# Patient Record
Sex: Male | Born: 1935 | Race: White | Hispanic: No | Smoking: Former smoker
Health system: Southern US, Community
[De-identification: ages and names within clinical notes are randomized; demographics above are authoritative.]

## PROBLEM LIST (undated history)

## (undated) DIAGNOSIS — K5792 Diverticulitis of intestine, part unspecified, without perforation or abscess without bleeding: Secondary | ICD-10-CM

## (undated) DIAGNOSIS — E785 Hyperlipidemia, unspecified: Secondary | ICD-10-CM

## (undated) DIAGNOSIS — D126 Benign neoplasm of colon, unspecified: Secondary | ICD-10-CM

## (undated) DIAGNOSIS — C801 Malignant (primary) neoplasm, unspecified: Secondary | ICD-10-CM

## (undated) DIAGNOSIS — I10 Essential (primary) hypertension: Secondary | ICD-10-CM

## (undated) HISTORY — PX: SKIN CANCER EXCISION: SHX779

## (undated) HISTORY — PX: COLONOSCOPY: SHX174

## (undated) HISTORY — DX: Diverticulitis of intestine, part unspecified, without perforation or abscess without bleeding: K57.92

## (undated) HISTORY — DX: Hyperlipidemia, unspecified: E78.5

## (undated) HISTORY — DX: Malignant (primary) neoplasm, unspecified: C80.1

## (undated) HISTORY — DX: Essential (primary) hypertension: I10

## (undated) HISTORY — DX: Benign neoplasm of colon, unspecified: D12.6

## (undated) HISTORY — PX: POLYPECTOMY: SHX149

---

## 2001-11-29 ENCOUNTER — Emergency Department (HOSPITAL_COMMUNITY): Admission: EM | Admit: 2001-11-29 | Discharge: 2001-11-29 | Payer: Self-pay | Admitting: Emergency Medicine

## 2004-12-19 ENCOUNTER — Encounter (INDEPENDENT_AMBULATORY_CARE_PROVIDER_SITE_OTHER): Payer: Self-pay | Admitting: *Deleted

## 2004-12-19 ENCOUNTER — Ambulatory Visit (HOSPITAL_BASED_OUTPATIENT_CLINIC_OR_DEPARTMENT_OTHER): Admission: RE | Admit: 2004-12-19 | Discharge: 2004-12-19 | Payer: Self-pay | Admitting: Plastic Surgery

## 2008-01-12 ENCOUNTER — Ambulatory Visit: Payer: Self-pay | Admitting: Gastroenterology

## 2008-01-26 ENCOUNTER — Ambulatory Visit: Payer: Self-pay | Admitting: Gastroenterology

## 2008-01-26 ENCOUNTER — Encounter: Payer: Self-pay | Admitting: Gastroenterology

## 2008-01-27 ENCOUNTER — Encounter: Payer: Self-pay | Admitting: Gastroenterology

## 2009-10-16 ENCOUNTER — Inpatient Hospital Stay (HOSPITAL_COMMUNITY): Admission: EM | Admit: 2009-10-16 | Discharge: 2009-10-17 | Payer: Self-pay | Admitting: Emergency Medicine

## 2010-06-24 LAB — CARDIAC PANEL(CRET KIN+CKTOT+MB+TROPI)
CK, MB: 1.8 ng/mL (ref 0.3–4.0)
Relative Index: 1.6 (ref 0.0–2.5)
Total CK: 110 U/L (ref 7–232)
Troponin I: 0.02 ng/mL (ref 0.00–0.06)
Troponin I: 0.02 ng/mL (ref 0.00–0.06)

## 2010-06-24 LAB — COMPREHENSIVE METABOLIC PANEL
ALT: 23 U/L (ref 0–53)
Albumin: 3.9 g/dL (ref 3.5–5.2)
Alkaline Phosphatase: 50 U/L (ref 39–117)
Creatinine, Ser: 1.05 mg/dL (ref 0.4–1.5)
Glucose, Bld: 95 mg/dL (ref 70–99)
Potassium: 3.5 mEq/L (ref 3.5–5.1)
Total Bilirubin: 0.8 mg/dL (ref 0.3–1.2)

## 2010-06-24 LAB — CBC
Hemoglobin: 13.8 g/dL (ref 13.0–17.0)
MCH: 32.5 pg (ref 26.0–34.0)
RBC: 4.19 MIL/uL — ABNORMAL LOW (ref 4.22–5.81)
RBC: 4.3 MIL/uL (ref 4.22–5.81)
RDW: 13.2 % (ref 11.5–15.5)
RDW: 13.3 % (ref 11.5–15.5)
WBC: 6.7 10*3/uL (ref 4.0–10.5)

## 2010-06-24 LAB — BASIC METABOLIC PANEL
CO2: 30 mEq/L (ref 19–32)
Creatinine, Ser: 0.99 mg/dL (ref 0.4–1.5)
Sodium: 138 mEq/L (ref 135–145)

## 2010-06-24 LAB — DIFFERENTIAL
Basophils Absolute: 0 10*3/uL (ref 0.0–0.1)
Eosinophils Absolute: 0.2 10*3/uL (ref 0.0–0.7)
Eosinophils Relative: 3 % (ref 0–5)
Lymphs Abs: 2.4 10*3/uL (ref 0.7–4.0)
Monocytes Absolute: 0.5 10*3/uL (ref 0.1–1.0)
Monocytes Relative: 8 % (ref 3–12)

## 2010-06-24 LAB — URINALYSIS, ROUTINE W REFLEX MICROSCOPIC
Glucose, UA: NEGATIVE mg/dL
Hgb urine dipstick: NEGATIVE
Nitrite: NEGATIVE
Specific Gravity, Urine: 1.016 (ref 1.005–1.030)
Urobilinogen, UA: 0.2 mg/dL (ref 0.0–1.0)

## 2010-06-24 LAB — LIPID PANEL: HDL: 36 mg/dL — ABNORMAL LOW (ref >39)

## 2010-06-24 LAB — APTT: aPTT: 36 seconds (ref 24–37)

## 2010-06-24 LAB — HEMOGLOBIN A1C
Hgb A1c MFr Bld: 5.7 % — ABNORMAL HIGH (ref <5.7)
Mean Plasma Glucose: 117 mg/dL — ABNORMAL HIGH (ref <117)

## 2010-06-24 LAB — PROTIME-INR: Prothrombin Time: 14.2 seconds (ref 11.6–15.2)

## 2011-02-14 ENCOUNTER — Encounter: Payer: Self-pay | Admitting: Gastroenterology

## 2011-04-11 ENCOUNTER — Encounter: Payer: Self-pay | Admitting: Gastroenterology

## 2011-05-01 ENCOUNTER — Ambulatory Visit (AMBULATORY_SURGERY_CENTER): Payer: Medicare Other | Admitting: *Deleted

## 2011-05-01 DIAGNOSIS — Z1211 Encounter for screening for malignant neoplasm of colon: Secondary | ICD-10-CM

## 2011-05-01 DIAGNOSIS — Z86010 Personal history of colonic polyps: Secondary | ICD-10-CM

## 2011-05-01 MED ORDER — PEG-KCL-NACL-NASULF-NA ASC-C 100 G PO SOLR: 1.0000 | Freq: Once | ORAL | Status: DC

## 2011-05-10 DIAGNOSIS — D126 Benign neoplasm of colon, unspecified: Secondary | ICD-10-CM

## 2011-05-10 HISTORY — DX: Benign neoplasm of colon, unspecified: D12.6

## 2011-05-15 ENCOUNTER — Encounter: Payer: Self-pay | Admitting: Gastroenterology

## 2011-05-15 ENCOUNTER — Ambulatory Visit (AMBULATORY_SURGERY_CENTER): Payer: Medicare Other | Admitting: Gastroenterology

## 2011-05-15 DIAGNOSIS — Z1211 Encounter for screening for malignant neoplasm of colon: Secondary | ICD-10-CM | POA: Diagnosis not present

## 2011-05-15 DIAGNOSIS — D126 Benign neoplasm of colon, unspecified: Secondary | ICD-10-CM

## 2011-05-15 DIAGNOSIS — K5792 Diverticulitis of intestine, part unspecified, without perforation or abscess without bleeding: Secondary | ICD-10-CM

## 2011-05-15 DIAGNOSIS — Z86010 Personal history of colonic polyps: Secondary | ICD-10-CM | POA: Diagnosis not present

## 2011-05-15 MED ORDER — CIPROFLOXACIN HCL 500 MG PO TABS: 500.0000 mg | ORAL_TABLET | Freq: Two times a day (BID) | ORAL | Status: AC

## 2011-05-15 MED ORDER — SODIUM CHLORIDE 0.9 % IV SOLN: 500.0000 mL | INTRAVENOUS | Status: DC

## 2011-05-15 NOTE — Patient Instructions (Signed)
Discharge instructions given with verbal understanding.  Handouts on polyps and diverticulosis given.  Resume previous medications. 

## 2011-05-15 NOTE — Progress Notes (Signed)
Patient did not experience any of the following events: a burn prior to discharge; a fall within the facility; wrong site/side/patient/procedure/implant event; or a hospital transfer or hospital admission upon discharge from the facility. (G8907) Patient did not have preoperative order for IV antibiotic SSI prophylaxis. (G8918)  

## 2011-05-15 NOTE — Op Note (Signed)
[facility] [address] [place], [place]  [address]  COLONOSCOPY PROCEDURE REPORT  PATIENT:  Philip Donaldson, Philip Donaldson  MR#:  [id number] BIRTHDATE:  [birth year], 82 yrs. old  GENDER:  male ENDOSCOPIST:  Judie Petit T. Russella Dar, MD, [facility]  PROCEDURE DATE:  05/15/2011 PROCEDURE:  Colonoscopy with biopsy and snare polypectomy ASA CLASS:  Class II INDICATIONS:  1) surveillance and high-risk screening  2) history of pre-cancerous (adenomatous) colon polyps: 1998, 2004, 2009. MEDICATIONS:   These medications were titrated to patient response per physician's verbal order, Fentanyl 25 mcg IV, Versed 5 mg IV DESCRIPTION OF PROCEDURE:   After the risks benefits and alternatives of the procedure were thoroughly explained, informed consent was obtained.  Digital rectal exam was performed and revealed no abnormalities.   The LB 180AL [id number] endoscope was introduced through the anus and advanced to the cecum, which was identified by both the appendix and ileocecal valve, without limitations.  The quality of the prep was good, using MoviPrep. The instrument was then slowly withdrawn as the colon was fully examined. <<PROCEDUREIMAGES>> FINDINGS:  A sessile polyp was found in the ascending colon. It was 4 mm in size. The polyp was removed using cold biopsy forceps. A sessile polyp was found at the hepatic flexure. It was 4 mm in size. The polyp was removed using cold biopsy forceps.  A sessile polyp was found in the sigmoid colon. It was 6 mm in size. Polyp was snared without cautery. Retrieval was successful.  Moderate diverticulosis was found in the sigmoid colon.  Diverticulitis was found in the sigmoid colon. Otherwise normal colonoscopy without other polyps, masses, vascular ectasias, or inflammatory changes. Retroflexed views in the rectum revealed no abnormalities.   The time to cecum =  2  minutes. The scope was then withdrawn (time = 11.75  min) from the patient and the procedure  completed.  COMPLICATIONS:  None  ENDOSCOPIC IMPRESSION: 1) 4 mm sessile polyp in the ascending colon 2) 4 mm sessile polyp at the hepatic flexure 3) 6 mm sessile polyp in the sigmoid colon 4) Moderate diverticulosis in the sigmoid colon 5) Diverticulitis in the sigmoid colon  RECOMMENDATIONS: 1) Await pathology results 2) High fiber diet with liberal fluid intake. 3) Cipro 500 mg po bid, #14 4) Given your age, you will not need another colonoscopy for colon cancer screening or polyp surveillance. These types of tests usually stop around the age 83.  Venita Lick. Russella Dar, MD, [facility]  n. eSIGNED:   Venita Lick. Lamere Lightner at 05/15/2011 09:00 AM  [place], Onalee Hua, [phone]

## 2011-05-16 ENCOUNTER — Telehealth: Payer: Self-pay | Admitting: *Deleted

## 2011-05-16 NOTE — Telephone Encounter (Signed)
  Follow up Call-  Call back number 05/15/2011  Post procedure Call Back phone  # [phone]  Permission to leave phone message Yes     Patient questions:  Do you have a fever, pain , or abdominal swelling? no Pain Score  0 *  Have you tolerated food without any problems? no  Have you been able to return to your normal activities? yes  Do you have any questions about your discharge instructions: Diet   no Medications  no Follow up visit  no  Do you have questions or concerns about your Care? no  Actions: * If pain score is 4 or above: No action needed, pain <4.

## 2011-05-16 NOTE — Telephone Encounter (Signed)
  Follow up Call-  Call back number 05/15/2011  Post procedure Call Back phone  # [phone]  Permission to leave phone message Yes     Patient questions:  Do you have a fever, pain , or abdominal swelling? no Pain Score  0 *  Have you tolerated food without any problems? yes  Have you been able to return to your normal activities? yes  Do you have any questions about your discharge instructions: Diet   no Medications  no Follow up visit  no  Do you have questions or concerns about your Care? no  Actions: * If pain score is 4 or above: No action needed, pain <4.

## 2011-05-30 ENCOUNTER — Encounter: Payer: Self-pay | Admitting: Gastroenterology

## 2011-06-05 ENCOUNTER — Telehealth: Payer: Self-pay | Admitting: Gastroenterology

## 2011-06-05 NOTE — Telephone Encounter (Signed)
Yes Analpram tid prn

## 2011-06-05 NOTE — Telephone Encounter (Signed)
Patient reports rectal itch since colon on 05/15/11.  He is using OTC products that seem to control the itch, but not clear it up.  Dr Russella Dar is is ok to call in analpram?

## 2011-06-06 MED ORDER — PRAMOXINE-HC 1-2.5 % EX CREA: TOPICAL_CREAM | CUTANEOUS | Status: AC

## 2011-06-06 NOTE — Telephone Encounter (Signed)
Patient's wife advised.  They are asked to call back for any further questions or concerns

## 2011-06-25 ENCOUNTER — Telehealth: Payer: Self-pay | Admitting: Gastroenterology

## 2011-06-25 NOTE — Telephone Encounter (Signed)
Patient is having continued rectal itching.  It is around his rectum and the analpram is not helping.  I have asked them to try some monistat to the rectal area, to rule out a yeast rash.  She will try this.

## 2011-08-19 DIAGNOSIS — E7800 Pure hypercholesterolemia, unspecified: Secondary | ICD-10-CM | POA: Diagnosis not present

## 2011-08-19 DIAGNOSIS — I10 Essential (primary) hypertension: Secondary | ICD-10-CM | POA: Diagnosis not present

## 2011-08-19 DIAGNOSIS — Z79899 Other long term (current) drug therapy: Secondary | ICD-10-CM | POA: Diagnosis not present

## 2012-01-07 DIAGNOSIS — H40019 Open angle with borderline findings, low risk, unspecified eye: Secondary | ICD-10-CM | POA: Diagnosis not present

## 2012-01-13 DIAGNOSIS — Z23 Encounter for immunization: Secondary | ICD-10-CM | POA: Diagnosis not present

## 2012-02-02 DIAGNOSIS — M109 Gout, unspecified: Secondary | ICD-10-CM

## 2012-02-02 HISTORY — DX: Gout, unspecified: M10.9

## 2012-02-18 DIAGNOSIS — H2510 Age-related nuclear cataract, unspecified eye: Secondary | ICD-10-CM | POA: Diagnosis not present

## 2012-02-18 DIAGNOSIS — H409 Unspecified glaucoma: Secondary | ICD-10-CM | POA: Diagnosis not present

## 2012-02-18 DIAGNOSIS — H4011X0 Primary open-angle glaucoma, stage unspecified: Secondary | ICD-10-CM | POA: Diagnosis not present

## 2012-03-02 DIAGNOSIS — I10 Essential (primary) hypertension: Secondary | ICD-10-CM | POA: Diagnosis not present

## 2012-03-02 DIAGNOSIS — Z79899 Other long term (current) drug therapy: Secondary | ICD-10-CM | POA: Diagnosis not present

## 2012-03-02 DIAGNOSIS — E7800 Pure hypercholesterolemia, unspecified: Secondary | ICD-10-CM | POA: Diagnosis not present

## 2012-03-02 DIAGNOSIS — Z1331 Encounter for screening for depression: Secondary | ICD-10-CM | POA: Diagnosis not present

## 2012-03-11 DIAGNOSIS — L821 Other seborrheic keratosis: Secondary | ICD-10-CM | POA: Diagnosis not present

## 2012-03-11 DIAGNOSIS — Z85820 Personal history of malignant melanoma of skin: Secondary | ICD-10-CM | POA: Diagnosis not present

## 2012-03-11 DIAGNOSIS — L570 Actinic keratosis: Secondary | ICD-10-CM | POA: Diagnosis not present

## 2012-03-11 DIAGNOSIS — D045 Carcinoma in situ of skin of trunk: Secondary | ICD-10-CM | POA: Diagnosis not present

## 2012-03-11 DIAGNOSIS — C44529 Squamous cell carcinoma of skin of other part of trunk: Secondary | ICD-10-CM | POA: Diagnosis not present

## 2012-03-25 DIAGNOSIS — D045 Carcinoma in situ of skin of trunk: Secondary | ICD-10-CM | POA: Diagnosis not present

## 2012-04-27 DIAGNOSIS — D045 Carcinoma in situ of skin of trunk: Secondary | ICD-10-CM | POA: Diagnosis not present

## 2012-06-02 DIAGNOSIS — H409 Unspecified glaucoma: Secondary | ICD-10-CM | POA: Diagnosis not present

## 2012-06-02 DIAGNOSIS — H4011X0 Primary open-angle glaucoma, stage unspecified: Secondary | ICD-10-CM | POA: Diagnosis not present

## 2012-09-01 DIAGNOSIS — M7989 Other specified soft tissue disorders: Secondary | ICD-10-CM | POA: Diagnosis not present

## 2012-09-01 DIAGNOSIS — Z79899 Other long term (current) drug therapy: Secondary | ICD-10-CM | POA: Diagnosis not present

## 2012-09-01 DIAGNOSIS — E7800 Pure hypercholesterolemia, unspecified: Secondary | ICD-10-CM | POA: Diagnosis not present

## 2012-09-01 DIAGNOSIS — I10 Essential (primary) hypertension: Secondary | ICD-10-CM | POA: Diagnosis not present

## 2012-09-29 DIAGNOSIS — H2510 Age-related nuclear cataract, unspecified eye: Secondary | ICD-10-CM | POA: Diagnosis not present

## 2012-11-25 DIAGNOSIS — F05 Delirium due to known physiological condition: Secondary | ICD-10-CM | POA: Diagnosis not present

## 2012-11-25 DIAGNOSIS — I10 Essential (primary) hypertension: Secondary | ICD-10-CM | POA: Diagnosis not present

## 2012-11-25 DIAGNOSIS — Z79899 Other long term (current) drug therapy: Secondary | ICD-10-CM | POA: Diagnosis not present

## 2012-11-25 DIAGNOSIS — E7800 Pure hypercholesterolemia, unspecified: Secondary | ICD-10-CM | POA: Diagnosis not present

## 2013-01-05 DIAGNOSIS — Z23 Encounter for immunization: Secondary | ICD-10-CM | POA: Diagnosis not present

## 2013-01-26 DIAGNOSIS — H409 Unspecified glaucoma: Secondary | ICD-10-CM | POA: Diagnosis not present

## 2013-01-26 DIAGNOSIS — H4011X0 Primary open-angle glaucoma, stage unspecified: Secondary | ICD-10-CM | POA: Diagnosis not present

## 2013-05-17 DIAGNOSIS — D235 Other benign neoplasm of skin of trunk: Secondary | ICD-10-CM | POA: Diagnosis not present

## 2013-05-17 DIAGNOSIS — D045 Carcinoma in situ of skin of trunk: Secondary | ICD-10-CM | POA: Diagnosis not present

## 2013-05-17 DIAGNOSIS — L538 Other specified erythematous conditions: Secondary | ICD-10-CM | POA: Diagnosis not present

## 2013-05-17 DIAGNOSIS — Z85820 Personal history of malignant melanoma of skin: Secondary | ICD-10-CM | POA: Diagnosis not present

## 2013-05-26 DIAGNOSIS — H409 Unspecified glaucoma: Secondary | ICD-10-CM | POA: Diagnosis not present

## 2013-05-26 DIAGNOSIS — H4011X0 Primary open-angle glaucoma, stage unspecified: Secondary | ICD-10-CM | POA: Diagnosis not present

## 2013-05-28 DIAGNOSIS — Z79899 Other long term (current) drug therapy: Secondary | ICD-10-CM | POA: Diagnosis not present

## 2013-05-28 DIAGNOSIS — E7800 Pure hypercholesterolemia, unspecified: Secondary | ICD-10-CM | POA: Diagnosis not present

## 2013-05-28 DIAGNOSIS — Z1331 Encounter for screening for depression: Secondary | ICD-10-CM | POA: Diagnosis not present

## 2013-05-28 DIAGNOSIS — I10 Essential (primary) hypertension: Secondary | ICD-10-CM | POA: Diagnosis not present

## 2013-08-20 ENCOUNTER — Encounter: Payer: Self-pay | Admitting: Gastroenterology

## 2013-09-22 DIAGNOSIS — H409 Unspecified glaucoma: Secondary | ICD-10-CM | POA: Diagnosis not present

## 2013-09-22 DIAGNOSIS — H4011X0 Primary open-angle glaucoma, stage unspecified: Secondary | ICD-10-CM | POA: Diagnosis not present

## 2013-10-13 DIAGNOSIS — D234 Other benign neoplasm of skin of scalp and neck: Secondary | ICD-10-CM | POA: Diagnosis not present

## 2013-10-20 ENCOUNTER — Ambulatory Visit (INDEPENDENT_AMBULATORY_CARE_PROVIDER_SITE_OTHER): Payer: Medicare Other | Admitting: Gastroenterology

## 2013-10-20 ENCOUNTER — Encounter: Payer: Self-pay | Admitting: Gastroenterology

## 2013-10-20 VITALS — BP 142/76 | HR 58 | Ht 66.0 in | Wt 188.0 lb

## 2013-10-20 DIAGNOSIS — K921 Melena: Secondary | ICD-10-CM

## 2013-10-20 DIAGNOSIS — L290 Pruritus ani: Secondary | ICD-10-CM

## 2013-10-20 MED ORDER — HYDROCORTISONE ACETATE 25 MG RE SUPP: 25.0000 mg | Freq: Every day | RECTAL | Status: DC

## 2013-10-20 NOTE — Progress Notes (Signed)
    History of Present Illness: This is an 80 year old male who relates intermittent rectal itching and small volume rectal bleeding. He has a history of adenomatous colon polyps and has undergone several colonoscopies over the years. His most recent colonoscopy was performed in February 2013 with 3 colon polyps, diverticulosis, and diverticulitis reported. I reviewed the endoscopic images and retroflex view shows small internal hemorrhoids.    Current Medications, Allergies, Past Medical History, Past Surgical History, Family History and Social History were reviewed in [employer] record.  Physical Exam: General: Well developed , well nourished, no acute distress Head: Normocephalic and atraumatic Eyes:  sclerae anicteric, EOMI Ears: Normal auditory acuity Mouth: No deformity or lesions Lungs: Clear throughout to auscultation Heart: Regular rate and rhythm; no murmurs, rubs or bruits Abdomen: Soft, non tender and non distended. No masses, hepatosplenomegaly or hernias noted. Normal Bowel sounds Rectal: No lesions, no skin abnormalities, no tenderness, Hemoccult-negative brown stool in the vault Musculoskeletal: Symmetrical with no gross deformities  Pulses:  Normal pulses noted Extremities: No clubbing, cyanosis, edema or deformities noted Neurological: Alert oriented x 4, grossly nonfocal Psychological:  Alert and cooperative. Normal mood and affect  Assessment and Recommendations:  1. Hematochezia. Anal itching. I suspect these symptoms are secondary to small internal hemorrhoids. Begin standard rectal care instructions and Anusol-HC suppositories daily as needed.

## 2013-10-20 NOTE — Patient Instructions (Signed)
We have sent the following medications to your pharmacy for you to pick up at your convenience:Ansuol [facility] suppositories.  RECTAL CARE INSTRUCTIONS:  1. Sitz Baths twice a day for 10 minutes each. 2. Thoroughly clean and dry the rectum. 3. Put Tucks pad against the rectum at night. 4. Clean the rectum with Balenol lotion after each bowel movement.  Thank you for choosing me and [facility].  Pricilla Riffle. Dagoberto Ligas., MD., Marval Regal  cc: Leighton Ruff, MD

## 2014-01-01 DIAGNOSIS — Z23 Encounter for immunization: Secondary | ICD-10-CM | POA: Diagnosis not present

## 2014-01-01 DIAGNOSIS — Z0000 Encounter for general adult medical examination without abnormal findings: Secondary | ICD-10-CM | POA: Diagnosis not present

## 2014-01-24 DIAGNOSIS — L820 Inflamed seborrheic keratosis: Secondary | ICD-10-CM | POA: Diagnosis not present

## 2014-03-18 DIAGNOSIS — H4011X1 Primary open-angle glaucoma, mild stage: Secondary | ICD-10-CM | POA: Diagnosis not present

## 2014-04-13 DIAGNOSIS — L820 Inflamed seborrheic keratosis: Secondary | ICD-10-CM | POA: Diagnosis not present

## 2014-04-13 DIAGNOSIS — Z85820 Personal history of malignant melanoma of skin: Secondary | ICD-10-CM | POA: Diagnosis not present

## 2014-04-13 DIAGNOSIS — D225 Melanocytic nevi of trunk: Secondary | ICD-10-CM | POA: Diagnosis not present

## 2014-04-13 DIAGNOSIS — C4441 Basal cell carcinoma of skin of scalp and neck: Secondary | ICD-10-CM | POA: Diagnosis not present

## 2014-04-13 DIAGNOSIS — Z08 Encounter for follow-up examination after completed treatment for malignant neoplasm: Secondary | ICD-10-CM | POA: Diagnosis not present

## 2014-04-13 DIAGNOSIS — L430 Hypertrophic lichen planus: Secondary | ICD-10-CM | POA: Diagnosis not present

## 2014-04-13 DIAGNOSIS — L821 Other seborrheic keratosis: Secondary | ICD-10-CM | POA: Diagnosis not present

## 2014-05-02 DIAGNOSIS — C4441 Basal cell carcinoma of skin of scalp and neck: Secondary | ICD-10-CM | POA: Diagnosis not present

## 2014-05-03 DIAGNOSIS — E780 Pure hypercholesterolemia: Secondary | ICD-10-CM | POA: Diagnosis not present

## 2014-05-03 DIAGNOSIS — Z79899 Other long term (current) drug therapy: Secondary | ICD-10-CM | POA: Diagnosis not present

## 2014-05-03 DIAGNOSIS — I10 Essential (primary) hypertension: Secondary | ICD-10-CM | POA: Diagnosis not present

## 2014-05-12 DIAGNOSIS — M109 Gout, unspecified: Secondary | ICD-10-CM | POA: Diagnosis not present

## 2014-06-06 DIAGNOSIS — L929 Granulomatous disorder of the skin and subcutaneous tissue, unspecified: Secondary | ICD-10-CM | POA: Diagnosis not present

## 2014-06-06 DIAGNOSIS — Z08 Encounter for follow-up examination after completed treatment for malignant neoplasm: Secondary | ICD-10-CM | POA: Diagnosis not present

## 2014-06-06 DIAGNOSIS — Z85828 Personal history of other malignant neoplasm of skin: Secondary | ICD-10-CM | POA: Diagnosis not present

## 2014-08-03 DIAGNOSIS — H2513 Age-related nuclear cataract, bilateral: Secondary | ICD-10-CM | POA: Diagnosis not present

## 2014-08-03 DIAGNOSIS — H4011X1 Primary open-angle glaucoma, mild stage: Secondary | ICD-10-CM | POA: Diagnosis not present

## 2014-09-30 ENCOUNTER — Encounter: Payer: Self-pay | Admitting: Gastroenterology

## 2014-12-06 DIAGNOSIS — H4011X1 Primary open-angle glaucoma, mild stage: Secondary | ICD-10-CM | POA: Diagnosis not present

## 2014-12-28 DIAGNOSIS — Z23 Encounter for immunization: Secondary | ICD-10-CM | POA: Diagnosis not present

## 2015-03-16 DIAGNOSIS — M25572 Pain in left ankle and joints of left foot: Secondary | ICD-10-CM | POA: Diagnosis not present

## 2015-03-16 DIAGNOSIS — M109 Gout, unspecified: Secondary | ICD-10-CM | POA: Diagnosis not present

## 2015-04-11 DIAGNOSIS — H401131 Primary open-angle glaucoma, bilateral, mild stage: Secondary | ICD-10-CM | POA: Diagnosis not present

## 2015-04-11 DIAGNOSIS — H2513 Age-related nuclear cataract, bilateral: Secondary | ICD-10-CM | POA: Diagnosis not present

## 2015-05-09 DIAGNOSIS — Z1389 Encounter for screening for other disorder: Secondary | ICD-10-CM | POA: Diagnosis not present

## 2015-05-09 DIAGNOSIS — I10 Essential (primary) hypertension: Secondary | ICD-10-CM | POA: Diagnosis not present

## 2015-05-09 DIAGNOSIS — N183 Chronic kidney disease, stage 3 (moderate): Secondary | ICD-10-CM | POA: Diagnosis not present

## 2015-05-09 DIAGNOSIS — E7800 Pure hypercholesterolemia, unspecified: Secondary | ICD-10-CM | POA: Diagnosis not present

## 2015-08-16 DIAGNOSIS — H401131 Primary open-angle glaucoma, bilateral, mild stage: Secondary | ICD-10-CM | POA: Diagnosis not present

## 2015-12-20 DIAGNOSIS — H401131 Primary open-angle glaucoma, bilateral, mild stage: Secondary | ICD-10-CM | POA: Diagnosis not present

## 2016-01-01 DIAGNOSIS — K130 Diseases of lips: Secondary | ICD-10-CM | POA: Diagnosis not present

## 2016-01-01 DIAGNOSIS — L218 Other seborrheic dermatitis: Secondary | ICD-10-CM | POA: Diagnosis not present

## 2016-01-01 DIAGNOSIS — Z1283 Encounter for screening for malignant neoplasm of skin: Secondary | ICD-10-CM | POA: Diagnosis not present

## 2016-01-01 DIAGNOSIS — Z85820 Personal history of malignant melanoma of skin: Secondary | ICD-10-CM | POA: Diagnosis not present

## 2016-01-01 DIAGNOSIS — Z08 Encounter for follow-up examination after completed treatment for malignant neoplasm: Secondary | ICD-10-CM | POA: Diagnosis not present

## 2016-01-09 DIAGNOSIS — Z23 Encounter for immunization: Secondary | ICD-10-CM | POA: Diagnosis not present

## 2016-04-26 DIAGNOSIS — H401131 Primary open-angle glaucoma, bilateral, mild stage: Secondary | ICD-10-CM | POA: Diagnosis not present

## 2016-05-12 DIAGNOSIS — J209 Acute bronchitis, unspecified: Secondary | ICD-10-CM | POA: Diagnosis not present

## 2016-07-02 DIAGNOSIS — I10 Essential (primary) hypertension: Secondary | ICD-10-CM | POA: Diagnosis not present

## 2016-07-02 DIAGNOSIS — N183 Chronic kidney disease, stage 3 (moderate): Secondary | ICD-10-CM | POA: Diagnosis not present

## 2016-07-02 DIAGNOSIS — E7800 Pure hypercholesterolemia, unspecified: Secondary | ICD-10-CM | POA: Diagnosis not present

## 2016-08-30 DIAGNOSIS — H401131 Primary open-angle glaucoma, bilateral, mild stage: Secondary | ICD-10-CM | POA: Diagnosis not present

## 2017-01-03 DIAGNOSIS — H2513 Age-related nuclear cataract, bilateral: Secondary | ICD-10-CM | POA: Diagnosis not present

## 2017-01-03 DIAGNOSIS — H401131 Primary open-angle glaucoma, bilateral, mild stage: Secondary | ICD-10-CM | POA: Diagnosis not present

## 2017-01-11 DIAGNOSIS — Z23 Encounter for immunization: Secondary | ICD-10-CM | POA: Diagnosis not present

## 2017-04-30 DIAGNOSIS — Z08 Encounter for follow-up examination after completed treatment for malignant neoplasm: Secondary | ICD-10-CM | POA: Diagnosis not present

## 2017-04-30 DIAGNOSIS — L821 Other seborrheic keratosis: Secondary | ICD-10-CM | POA: Diagnosis not present

## 2017-04-30 DIAGNOSIS — Z85820 Personal history of malignant melanoma of skin: Secondary | ICD-10-CM | POA: Diagnosis not present

## 2017-04-30 DIAGNOSIS — Z1283 Encounter for screening for malignant neoplasm of skin: Secondary | ICD-10-CM | POA: Diagnosis not present

## 2017-05-02 DIAGNOSIS — H401131 Primary open-angle glaucoma, bilateral, mild stage: Secondary | ICD-10-CM | POA: Diagnosis not present

## 2017-05-02 DIAGNOSIS — H2513 Age-related nuclear cataract, bilateral: Secondary | ICD-10-CM | POA: Diagnosis not present

## 2017-06-03 DIAGNOSIS — N183 Chronic kidney disease, stage 3 (moderate): Secondary | ICD-10-CM | POA: Diagnosis not present

## 2017-06-03 DIAGNOSIS — I10 Essential (primary) hypertension: Secondary | ICD-10-CM | POA: Diagnosis not present

## 2017-06-03 DIAGNOSIS — R634 Abnormal weight loss: Secondary | ICD-10-CM | POA: Diagnosis not present

## 2017-06-03 DIAGNOSIS — E7800 Pure hypercholesterolemia, unspecified: Secondary | ICD-10-CM | POA: Diagnosis not present

## 2017-11-12 DIAGNOSIS — H11131 Conjunctival pigmentations, right eye: Secondary | ICD-10-CM | POA: Diagnosis not present

## 2017-11-12 DIAGNOSIS — H2513 Age-related nuclear cataract, bilateral: Secondary | ICD-10-CM | POA: Diagnosis not present

## 2017-11-12 DIAGNOSIS — H401131 Primary open-angle glaucoma, bilateral, mild stage: Secondary | ICD-10-CM | POA: Diagnosis not present

## 2018-01-01 DIAGNOSIS — Z23 Encounter for immunization: Secondary | ICD-10-CM | POA: Diagnosis not present

## 2018-01-16 DIAGNOSIS — I10 Essential (primary) hypertension: Secondary | ICD-10-CM | POA: Diagnosis not present

## 2018-01-16 DIAGNOSIS — E7800 Pure hypercholesterolemia, unspecified: Secondary | ICD-10-CM | POA: Diagnosis not present

## 2018-01-16 DIAGNOSIS — N183 Chronic kidney disease, stage 3 (moderate): Secondary | ICD-10-CM | POA: Diagnosis not present

## 2018-02-13 DIAGNOSIS — H401131 Primary open-angle glaucoma, bilateral, mild stage: Secondary | ICD-10-CM | POA: Diagnosis not present

## 2018-02-13 DIAGNOSIS — H2513 Age-related nuclear cataract, bilateral: Secondary | ICD-10-CM | POA: Diagnosis not present

## 2018-06-09 DIAGNOSIS — H2513 Age-related nuclear cataract, bilateral: Secondary | ICD-10-CM | POA: Diagnosis not present

## 2018-06-09 DIAGNOSIS — H401131 Primary open-angle glaucoma, bilateral, mild stage: Secondary | ICD-10-CM | POA: Diagnosis not present

## 2018-08-25 DIAGNOSIS — C44311 Basal cell carcinoma of skin of nose: Secondary | ICD-10-CM | POA: Diagnosis not present

## 2018-08-25 DIAGNOSIS — L821 Other seborrheic keratosis: Secondary | ICD-10-CM | POA: Diagnosis not present

## 2018-08-25 DIAGNOSIS — L570 Actinic keratosis: Secondary | ICD-10-CM | POA: Diagnosis not present

## 2018-08-25 DIAGNOSIS — X32XXXD Exposure to sunlight, subsequent encounter: Secondary | ICD-10-CM | POA: Diagnosis not present

## 2018-08-25 DIAGNOSIS — Z86006 Personal history of melanoma in-situ: Secondary | ICD-10-CM | POA: Diagnosis not present

## 2018-08-25 DIAGNOSIS — Z08 Encounter for follow-up examination after completed treatment for malignant neoplasm: Secondary | ICD-10-CM | POA: Diagnosis not present

## 2018-08-25 DIAGNOSIS — Z1283 Encounter for screening for malignant neoplasm of skin: Secondary | ICD-10-CM | POA: Diagnosis not present

## 2018-08-28 DIAGNOSIS — E7800 Pure hypercholesterolemia, unspecified: Secondary | ICD-10-CM | POA: Diagnosis not present

## 2018-08-28 DIAGNOSIS — N183 Chronic kidney disease, stage 3 (moderate): Secondary | ICD-10-CM | POA: Diagnosis not present

## 2018-08-28 DIAGNOSIS — I10 Essential (primary) hypertension: Secondary | ICD-10-CM | POA: Diagnosis not present

## 2018-10-06 DIAGNOSIS — Z08 Encounter for follow-up examination after completed treatment for malignant neoplasm: Secondary | ICD-10-CM | POA: Diagnosis not present

## 2018-10-06 DIAGNOSIS — Z85828 Personal history of other malignant neoplasm of skin: Secondary | ICD-10-CM | POA: Diagnosis not present

## 2018-10-06 DIAGNOSIS — L820 Inflamed seborrheic keratosis: Secondary | ICD-10-CM | POA: Diagnosis not present

## 2018-10-13 DIAGNOSIS — H401131 Primary open-angle glaucoma, bilateral, mild stage: Secondary | ICD-10-CM | POA: Diagnosis not present

## 2018-10-13 DIAGNOSIS — H2513 Age-related nuclear cataract, bilateral: Secondary | ICD-10-CM | POA: Diagnosis not present

## 2019-01-07 DIAGNOSIS — Z23 Encounter for immunization: Secondary | ICD-10-CM | POA: Diagnosis not present

## 2019-01-14 DIAGNOSIS — H2513 Age-related nuclear cataract, bilateral: Secondary | ICD-10-CM | POA: Diagnosis not present

## 2019-01-14 DIAGNOSIS — H11131 Conjunctival pigmentations, right eye: Secondary | ICD-10-CM | POA: Diagnosis not present

## 2019-01-14 DIAGNOSIS — H401131 Primary open-angle glaucoma, bilateral, mild stage: Secondary | ICD-10-CM | POA: Diagnosis not present

## 2019-01-28 DIAGNOSIS — E7800 Pure hypercholesterolemia, unspecified: Secondary | ICD-10-CM | POA: Diagnosis not present

## 2019-01-28 DIAGNOSIS — N183 Chronic kidney disease, stage 3 unspecified: Secondary | ICD-10-CM | POA: Diagnosis not present

## 2019-01-28 DIAGNOSIS — I10 Essential (primary) hypertension: Secondary | ICD-10-CM | POA: Diagnosis not present

## 2019-03-02 DIAGNOSIS — I10 Essential (primary) hypertension: Secondary | ICD-10-CM | POA: Diagnosis not present

## 2019-03-02 DIAGNOSIS — E7800 Pure hypercholesterolemia, unspecified: Secondary | ICD-10-CM | POA: Diagnosis not present

## 2019-03-02 DIAGNOSIS — Z1389 Encounter for screening for other disorder: Secondary | ICD-10-CM | POA: Diagnosis not present

## 2019-04-14 DIAGNOSIS — E7800 Pure hypercholesterolemia, unspecified: Secondary | ICD-10-CM | POA: Diagnosis not present

## 2019-04-14 DIAGNOSIS — I10 Essential (primary) hypertension: Secondary | ICD-10-CM | POA: Diagnosis not present

## 2019-04-23 DIAGNOSIS — H11131 Conjunctival pigmentations, right eye: Secondary | ICD-10-CM | POA: Diagnosis not present

## 2019-04-23 DIAGNOSIS — H2513 Age-related nuclear cataract, bilateral: Secondary | ICD-10-CM | POA: Diagnosis not present

## 2019-04-23 DIAGNOSIS — H401131 Primary open-angle glaucoma, bilateral, mild stage: Secondary | ICD-10-CM | POA: Diagnosis not present

## 2019-05-17 DIAGNOSIS — I10 Essential (primary) hypertension: Secondary | ICD-10-CM | POA: Diagnosis not present

## 2019-05-17 DIAGNOSIS — E7800 Pure hypercholesterolemia, unspecified: Secondary | ICD-10-CM | POA: Diagnosis not present

## 2019-06-04 DIAGNOSIS — H2513 Age-related nuclear cataract, bilateral: Secondary | ICD-10-CM | POA: Diagnosis not present

## 2019-06-04 DIAGNOSIS — H11131 Conjunctival pigmentations, right eye: Secondary | ICD-10-CM | POA: Diagnosis not present

## 2019-06-04 DIAGNOSIS — H401131 Primary open-angle glaucoma, bilateral, mild stage: Secondary | ICD-10-CM | POA: Diagnosis not present

## 2019-06-04 DIAGNOSIS — Z23 Encounter for immunization: Secondary | ICD-10-CM | POA: Diagnosis not present

## 2019-07-02 DIAGNOSIS — Z23 Encounter for immunization: Secondary | ICD-10-CM | POA: Diagnosis not present

## 2019-07-29 DIAGNOSIS — I10 Essential (primary) hypertension: Secondary | ICD-10-CM | POA: Diagnosis not present

## 2019-07-29 DIAGNOSIS — N183 Chronic kidney disease, stage 3 unspecified: Secondary | ICD-10-CM | POA: Diagnosis not present

## 2019-07-29 DIAGNOSIS — E7800 Pure hypercholesterolemia, unspecified: Secondary | ICD-10-CM | POA: Diagnosis not present

## 2019-07-30 DIAGNOSIS — H2513 Age-related nuclear cataract, bilateral: Secondary | ICD-10-CM | POA: Diagnosis not present

## 2019-07-30 DIAGNOSIS — H11131 Conjunctival pigmentations, right eye: Secondary | ICD-10-CM | POA: Diagnosis not present

## 2019-07-30 DIAGNOSIS — H401131 Primary open-angle glaucoma, bilateral, mild stage: Secondary | ICD-10-CM | POA: Diagnosis not present

## 2019-07-30 DIAGNOSIS — H353111 Nonexudative age-related macular degeneration, right eye, early dry stage: Secondary | ICD-10-CM | POA: Diagnosis not present

## 2019-08-14 DIAGNOSIS — M10061 Idiopathic gout, right knee: Secondary | ICD-10-CM | POA: Diagnosis not present

## 2019-08-27 DIAGNOSIS — N183 Chronic kidney disease, stage 3 unspecified: Secondary | ICD-10-CM | POA: Diagnosis not present

## 2019-08-27 DIAGNOSIS — I10 Essential (primary) hypertension: Secondary | ICD-10-CM | POA: Diagnosis not present

## 2019-08-27 DIAGNOSIS — E7800 Pure hypercholesterolemia, unspecified: Secondary | ICD-10-CM | POA: Diagnosis not present

## 2019-09-03 DIAGNOSIS — N183 Chronic kidney disease, stage 3 unspecified: Secondary | ICD-10-CM | POA: Diagnosis not present

## 2019-09-03 DIAGNOSIS — E7800 Pure hypercholesterolemia, unspecified: Secondary | ICD-10-CM | POA: Diagnosis not present

## 2019-09-03 DIAGNOSIS — R5383 Other fatigue: Secondary | ICD-10-CM | POA: Diagnosis not present

## 2019-09-03 DIAGNOSIS — I10 Essential (primary) hypertension: Secondary | ICD-10-CM | POA: Diagnosis not present

## 2019-09-03 DIAGNOSIS — M25561 Pain in right knee: Secondary | ICD-10-CM | POA: Diagnosis not present

## 2019-09-03 DIAGNOSIS — D179 Benign lipomatous neoplasm, unspecified: Secondary | ICD-10-CM | POA: Diagnosis not present

## 2019-09-08 ENCOUNTER — Ambulatory Visit (INDEPENDENT_AMBULATORY_CARE_PROVIDER_SITE_OTHER): Payer: Medicare Other

## 2019-09-08 ENCOUNTER — Ambulatory Visit (INDEPENDENT_AMBULATORY_CARE_PROVIDER_SITE_OTHER): Payer: Medicare Other | Admitting: Orthopedic Surgery

## 2019-09-08 ENCOUNTER — Other Ambulatory Visit: Payer: Self-pay

## 2019-09-08 DIAGNOSIS — M25561 Pain in right knee: Secondary | ICD-10-CM

## 2019-09-08 DIAGNOSIS — M25461 Effusion, right knee: Secondary | ICD-10-CM

## 2019-09-09 ENCOUNTER — Encounter: Payer: Self-pay | Admitting: Orthopedic Surgery

## 2019-09-09 DIAGNOSIS — M25461 Effusion, right knee: Secondary | ICD-10-CM | POA: Diagnosis not present

## 2019-09-09 MED ORDER — BUPIVACAINE HCL 0.25 % IJ SOLN
4.0000 mL | INTRAMUSCULAR | Status: AC | PRN
  Administered 2019-09-09: 4 mL via INTRA_ARTICULAR

## 2019-09-09 MED ORDER — LIDOCAINE HCL 1 % IJ SOLN
5.0000 mL | INTRAMUSCULAR | Status: AC | PRN
  Administered 2019-09-09: 5 mL

## 2019-09-09 MED ORDER — METHYLPREDNISOLONE ACETATE 40 MG/ML IJ SUSP
40.0000 mg | INTRAMUSCULAR | Status: AC | PRN
  Administered 2019-09-09: 40 mg via INTRA_ARTICULAR

## 2019-09-09 NOTE — Progress Notes (Signed)
Office Visit Note   Patient: Philip Donaldson           Date of Birth: 11-May-1928           MRN: [id number] Visit Date: 09/08/2019 Requested by: Leighton Ruff, MD [place],  [place] [address] PCP: Leighton Ruff, MD  Subjective: Chief Complaint  Patient presents with  . Right Knee - Pain    HPI: [name] is an 86 year old active patient with right knee pain.  He likes to read books.  Denies any history of injury.  Pain has been going on for several months.  Describes both pain and swelling.  He had blood work done for uric acid which was normal per his report.  Denies any pain that wakes him from sleep at night.  Hurts him to weight-bear on that right leg.  Denies any fevers or chills.  He has had gout in other joints.              ROS: All systems reviewed are negative as they relate to the chief complaint within the history of present illness.  Patient denies  fevers or chills.   Assessment & Plan: Visit Diagnoses:  1. Right knee pain, unspecified chronicity   2. Effusion, right knee     Plan: Impression is right knee effusion with fairly normal radiographs except for moderate medial joint space narrowing in that right knee.  I think he may have either arthritis exacerbation with effusion or possibly gout which had already deposited in the joint with normal uric acid level in the blood.  Aspiration injection performed today.  Fluid sent for gout crystals.  Whether it is gout or arthritis flare I think the cortisone shot will help.  Follow-up as needed.  We will call him to let him know if there was gout in his knee.  Follow-Up Instructions: Return if symptoms worsen or fail to improve.   Orders:  Orders Placed This Encounter  Procedures  . Gram stain  . XR KNEE 3 VIEW RIGHT  . Cell count + diff,  w/ cryst-synvl fld   No orders of the defined types were placed in this encounter.     Procedures: Large Joint Inj: R knee on 09/09/2019 11:22 PM Indications:  diagnostic evaluation, joint swelling and pain Details: 18 G 1.5 in needle, superolateral approach  Arthrogram: No  Medications: 5 mL lidocaine 1 %; 40 mg methylPREDNISolone acetate 40 MG/ML; 4 mL bupivacaine 0.25 % Aspirate: yellow; sent for lab analysis Outcome: tolerated well, no immediate complications Procedure, treatment alternatives, risks and benefits explained, specific risks discussed. Consent was given by the patient. Immediately prior to procedure a time out was called to verify the correct patient, procedure, equipment, support staff and site/side marked as required. Patient was prepped and draped in the usual sterile fashion.       Clinical Data: No additional findings.  Objective: Vital Signs: There were no vitals taken for this visit.  Physical Exam:   Constitutional: Patient appears well-developed HEENT:  Head: Normocephalic Eyes:EOM are normal Neck: Normal range of motion Cardiovascular: Normal rate Pulmonary/chest: Effort normal Neurologic: Patient is alert Skin: Skin is warm Psychiatric: Patient has normal mood and affect    Ortho Exam: Ortho exam demonstrates pretty normal gait alignment.  Mild effusion right knee is present.  Extensor mechanism is intact.  Collateral and cruciate ligaments are stable.  Mild medial joint line tenderness is noted.  No masses lymphadenopathy or skin changes noted in that  right knee region.  Specialty Comments:  No specialty comments available.  Imaging: No results found.   PMFS History: There are no problems to display for this patient.  Past Medical History:  Diagnosis Date  . Cancer (HCC)    skin Ca-melanoma- scalp  . Cataract   . Diverticulitis   . Hyperlipidemia   . Hypertension   . Tubular adenoma of colon 05/2011    Family History  Problem Relation Age of Onset  . Colon cancer Neg Hx   . Esophageal cancer Neg Hx   . Rectal cancer Neg Hx   . Stomach cancer Neg Hx     Past Surgical History:    Procedure Laterality Date  . COLONOSCOPY    . POLYPECTOMY    . SKIN CANCER EXCISION     to scalp   Social History   Occupational History  . Occupation: Retired   Tobacco Use  . Smoking status: Former Research scientist (life sciences)  . Smokeless tobacco: Never Used  Substance and Sexual Activity  . Alcohol use: No  . Drug use: No  . Sexual activity: Not on file

## 2019-09-10 ENCOUNTER — Ambulatory Visit: Payer: Medicare Other | Admitting: Family Medicine

## 2019-09-10 LAB — SYNOVIAL CELL COUNT + DIFF, W/ CRYSTALS
Basophils, %: 0 %
Eosinophils-Synovial: 0 % (ref 0–2)
Lymphocytes-Synovial Fld: 83 % — ABNORMAL HIGH (ref 0–74)
Monocyte/Macrophage: 3 % (ref 0–69)
Neutrophil, Synovial: 14 % (ref 0–24)
Synoviocytes, %: 0 % (ref 0–15)
WBC, Synovial: 115 cells/uL (ref <150)

## 2019-09-10 LAB — GRAM STAIN
MICRO NUMBER:: 10543930
SPECIMEN QUALITY:: ADEQUATE

## 2019-09-10 NOTE — Progress Notes (Signed)
No gout pls calkl thx

## 2019-10-15 DIAGNOSIS — L720 Epidermal cyst: Secondary | ICD-10-CM | POA: Diagnosis not present

## 2019-10-15 DIAGNOSIS — Z1283 Encounter for screening for malignant neoplasm of skin: Secondary | ICD-10-CM | POA: Diagnosis not present

## 2019-10-15 DIAGNOSIS — D225 Melanocytic nevi of trunk: Secondary | ICD-10-CM | POA: Diagnosis not present

## 2019-10-15 DIAGNOSIS — Z85820 Personal history of malignant melanoma of skin: Secondary | ICD-10-CM | POA: Diagnosis not present

## 2019-10-15 DIAGNOSIS — L570 Actinic keratosis: Secondary | ICD-10-CM | POA: Diagnosis not present

## 2019-10-15 DIAGNOSIS — Z08 Encounter for follow-up examination after completed treatment for malignant neoplasm: Secondary | ICD-10-CM | POA: Diagnosis not present

## 2019-10-15 DIAGNOSIS — X32XXXD Exposure to sunlight, subsequent encounter: Secondary | ICD-10-CM | POA: Diagnosis not present

## 2019-10-22 DIAGNOSIS — N183 Chronic kidney disease, stage 3 unspecified: Secondary | ICD-10-CM | POA: Diagnosis not present

## 2019-10-22 DIAGNOSIS — I10 Essential (primary) hypertension: Secondary | ICD-10-CM | POA: Diagnosis not present

## 2019-10-22 DIAGNOSIS — E7800 Pure hypercholesterolemia, unspecified: Secondary | ICD-10-CM | POA: Diagnosis not present

## 2019-11-09 ENCOUNTER — Other Ambulatory Visit: Payer: Self-pay

## 2019-11-09 ENCOUNTER — Ambulatory Visit (INDEPENDENT_AMBULATORY_CARE_PROVIDER_SITE_OTHER): Payer: Medicare Other | Admitting: Family Medicine

## 2019-11-09 ENCOUNTER — Encounter: Payer: Self-pay | Admitting: Family Medicine

## 2019-11-09 DIAGNOSIS — M1711 Unilateral primary osteoarthritis, right knee: Secondary | ICD-10-CM | POA: Diagnosis not present

## 2019-11-09 MED ORDER — DICLOFENAC SODIUM 1 % EX GEL: 4.0000 g | Freq: Four times a day (QID) | CUTANEOUS | 6 refills | Status: DC | PRN

## 2019-11-09 NOTE — Progress Notes (Signed)
   Office Visit Note   Patient: Philip Donaldson           Date of Birth: 11/27/1928           MRN: [id number] Visit Date: 11/09/2019 Requested by: Leighton Ruff, MD [place],  [place] [address] PCP: Leighton Ruff, MD  Subjective: Chief Complaint  Patient presents with  . Right Knee - Pain    Continues to have pain in the knee, mainly medial aspect. Unsure if it is swollen again (Dr. Marlou Sa had aspirated the knee & injected cortisone 09/08/19).    HPI: He is here with persistent right knee pain.  In June Dr. Marlou Sa aspirated and injected his knee.  Fluid analysis did not show gout crystals.  Patient does have a history of gout.  Cortisone injection did not eliminate his pain, gout medications have not helped either.  Pain is mostly on the medial aspect, with occasional catching symptoms.  He has not had any locking.              ROS:   All other systems were reviewed and are negative.  Objective: Vital Signs: There were no vitals taken for this visit.  Physical Exam:  General:  Alert and oriented, in no acute distress. Pulm:  Breathing unlabored. Psy:  Normal mood, congruent affect. Skin: No erythema Right knee: 2+ effusion with no warmth.  Full active extension, flexion of 120 degrees.  He is very tender on the medial joint line, pain but no palpable click with McMurray's.  Imaging: No results found.  Assessment & Plan: 1.  Persistent right knee pain and effusion, possibly due to osteoarthritis but cannot rule out degenerative meniscus tear. -Discussed options with him and elected to obtain approval for gel injections.  Once approved, he will come back in for aspiration and injection. -Trial of Voltaren gel topically.     Procedures: No procedures performed  No notes on file     PMFS History: There are no problems to display for this patient.  Past Medical History:  Diagnosis Date  . Cancer (HCC)    skin Ca-melanoma- scalp  . Cataract   .  Diverticulitis   . Hyperlipidemia   . Hypertension   . Tubular adenoma of colon 05/2011    Family History  Problem Relation Age of Onset  . Colon cancer Neg Hx   . Esophageal cancer Neg Hx   . Rectal cancer Neg Hx   . Stomach cancer Neg Hx     Past Surgical History:  Procedure Laterality Date  . COLONOSCOPY    . POLYPECTOMY    . SKIN CANCER EXCISION     to scalp   Social History   Occupational History  . Occupation: Retired   Tobacco Use  . Smoking status: Former Research scientist (life sciences)  . Smokeless tobacco: Never Used  Substance and Sexual Activity  . Alcohol use: No  . Drug use: No  . Sexual activity: Not on file

## 2019-11-11 ENCOUNTER — Telehealth: Payer: Self-pay

## 2019-11-11 NOTE — Telephone Encounter (Signed)
-----   Message from Laurann Montana, RT sent at 11/11/2019  9:55 AM EDT ----- Can you please help with this? ----- Message ----- From: [name], MD Sent: 11/09/2019  10:06 AM EDT To: April Jackson, RMA  Please request approval for right knee gel injections for OA.

## 2019-11-11 NOTE — Telephone Encounter (Signed)
Submitted for VOB for Synvisc one-Right knee  °

## 2019-11-12 ENCOUNTER — Telehealth: Payer: Self-pay

## 2019-11-12 ENCOUNTER — Telehealth: Payer: Self-pay | Admitting: Family Medicine

## 2019-11-12 NOTE — Telephone Encounter (Signed)
Received call from darryl from [place] advised medication (Dyclofenac) was approved from 11/11/2019 - 11/10/2020 1 year    The number to contact Darryl is [phone] Opt 5

## 2019-11-12 NOTE — Telephone Encounter (Signed)
Approved for Synvisc One- Right knee Dr. Junius Roads Buy and Bill Covered @ 100%-2nd insurance to pick up cost after medicare No prior auth required   Ok to schedule @ next available

## 2019-11-12 NOTE — Telephone Encounter (Signed)
Lvm for pt to call back to schedule °

## 2019-11-22 ENCOUNTER — Other Ambulatory Visit: Payer: Self-pay

## 2019-11-22 ENCOUNTER — Ambulatory Visit (INDEPENDENT_AMBULATORY_CARE_PROVIDER_SITE_OTHER): Payer: Medicare Other | Admitting: Family Medicine

## 2019-11-22 ENCOUNTER — Encounter: Payer: Self-pay | Admitting: Family Medicine

## 2019-11-22 DIAGNOSIS — M1711 Unilateral primary osteoarthritis, right knee: Secondary | ICD-10-CM

## 2019-11-22 NOTE — Progress Notes (Signed)
Office Visit Note   Patient: Philip Donaldson           Date of Birth: 05/12/1928           MRN: [id number] Visit Date: 11/22/2019 Requested by: Leighton Ruff, MD [place],  [place] [address] PCP: Leighton Ruff, MD  Subjective: Chief Complaint  Patient presents with  . Right Knee - Pain, Follow-up    Synvisc-One injection    HPI: Patient is an 86 year old male presenting to clinic today requesting viscosupplementation injection of his right knee.  He has a history of osteoarthritis in this knee, with a poor response to cortisone injection.  This is complicated by history of gout, however recent aspiration of this knee did not reveal any crystals.  Patient states that he has been compliant with Voltaren gel as previously prescribed, which he states reduces his pain when he uses it-however this is very temporary.  He states that otherwise she is doing very well, with no additional concerns today.              ROS:   All other systems were reviewed and are negative.  Objective: Vital Signs: There were no vitals taken for this visit.  Physical Exam:  General:  Alert and oriented, in no acute distress. Pulm:  Breathing unlabored. Psy:  Normal mood, congruent affect. Skin: Right knee skin intact, with no overlying, rashes, or other lesions. Right knee: Antalgic gait, favoring right knee. Moderate effusion, with no significant erythema or warmth. Medial joint line tenderness.  Imaging: None today.  Assessment & Plan: 86 year old male presenting to clinic today for viscosupplementation injection to right knee.  Patient previously underwent cortisone injection to this knee along with aspiration, with an adequate response.  Risks and benefits of viscosupplementation were discussed and patient opted to proceed.  Procedure performed as described below, which patient tolerated very well.  Strict return precautions were discussed.  If no significant improvement with gel  injection, could consider prolotherapy.     Procedures: Right knee viscosupplementation injection: Risks and benefits discussed, verbal consent obtained. Overlying skin prepped in a sterile fashion using Betadine and alcohol wipes. 3 mL of lidocaine were injected into the right knee synovial space with 22-gauge 1-1/2 inch needle.  Lidocaine syringe was disconnected, and Synvisc One was injected into the right knee. Patient tolerated injection very well, and endorsed immediate improvement in his pain.    PMFS History: Patient Active Problem List   Diagnosis Date Noted  . Gout 02/02/2012   Past Medical History:  Diagnosis Date  . Cancer (HCC)    skin Ca-melanoma- scalp  . Cataract   . Diverticulitis   . Hyperlipidemia   . Hypertension   . Tubular adenoma of colon 05/2011    Family History  Problem Relation Age of Onset  . Colon cancer Neg Hx   . Esophageal cancer Neg Hx   . Rectal cancer Neg Hx   . Stomach cancer Neg Hx     Past Surgical History:  Procedure Laterality Date  . COLONOSCOPY    . POLYPECTOMY    . SKIN CANCER EXCISION     to scalp   Social History   Occupational History  . Occupation: Retired   Tobacco Use  . Smoking status: Former Research scientist (life sciences)  . Smokeless tobacco: Never Used  Substance and Sexual Activity  . Alcohol use: No  . Drug use: No  . Sexual activity: Not on file

## 2019-11-22 NOTE — Progress Notes (Signed)
I saw and examined the patient with Dr. Elouise Munroe and agree with assessment and plan as outlined.    Right knee Synvisc-One given today.  Return as needed.

## 2019-12-03 DIAGNOSIS — H2513 Age-related nuclear cataract, bilateral: Secondary | ICD-10-CM | POA: Diagnosis not present

## 2019-12-03 DIAGNOSIS — H353111 Nonexudative age-related macular degeneration, right eye, early dry stage: Secondary | ICD-10-CM | POA: Diagnosis not present

## 2019-12-03 DIAGNOSIS — H401131 Primary open-angle glaucoma, bilateral, mild stage: Secondary | ICD-10-CM | POA: Diagnosis not present

## 2019-12-20 DIAGNOSIS — Z23 Encounter for immunization: Secondary | ICD-10-CM | POA: Diagnosis not present

## 2020-01-14 DIAGNOSIS — H401112 Primary open-angle glaucoma, right eye, moderate stage: Secondary | ICD-10-CM | POA: Diagnosis not present

## 2020-01-14 DIAGNOSIS — N183 Chronic kidney disease, stage 3 unspecified: Secondary | ICD-10-CM | POA: Diagnosis not present

## 2020-01-14 DIAGNOSIS — H401121 Primary open-angle glaucoma, left eye, mild stage: Secondary | ICD-10-CM | POA: Diagnosis not present

## 2020-01-14 DIAGNOSIS — I10 Essential (primary) hypertension: Secondary | ICD-10-CM | POA: Diagnosis not present

## 2020-01-14 DIAGNOSIS — H2513 Age-related nuclear cataract, bilateral: Secondary | ICD-10-CM | POA: Diagnosis not present

## 2020-01-14 DIAGNOSIS — H353111 Nonexudative age-related macular degeneration, right eye, early dry stage: Secondary | ICD-10-CM | POA: Diagnosis not present

## 2020-01-14 DIAGNOSIS — E7800 Pure hypercholesterolemia, unspecified: Secondary | ICD-10-CM | POA: Diagnosis not present

## 2020-01-31 ENCOUNTER — Ambulatory Visit (INDEPENDENT_AMBULATORY_CARE_PROVIDER_SITE_OTHER): Payer: Medicare Other | Admitting: Family Medicine

## 2020-01-31 ENCOUNTER — Encounter: Payer: Self-pay | Admitting: Family Medicine

## 2020-01-31 ENCOUNTER — Other Ambulatory Visit: Payer: Self-pay

## 2020-01-31 VITALS — BP 154/76 | HR 57 | Temp 97.8°F | Wt 174.2 lb

## 2020-01-31 DIAGNOSIS — Z8739 Personal history of other diseases of the musculoskeletal system and connective tissue: Secondary | ICD-10-CM

## 2020-01-31 DIAGNOSIS — R2232 Localized swelling, mass and lump, left upper limb: Secondary | ICD-10-CM

## 2020-01-31 DIAGNOSIS — E7800 Pure hypercholesterolemia, unspecified: Secondary | ICD-10-CM

## 2020-01-31 DIAGNOSIS — I10 Essential (primary) hypertension: Secondary | ICD-10-CM

## 2020-01-31 HISTORY — DX: Localized swelling, mass and lump, left upper limb: R22.32

## 2020-01-31 HISTORY — DX: Personal history of other diseases of the musculoskeletal system and connective tissue: Z87.39

## 2020-01-31 HISTORY — DX: Essential (primary) hypertension: I10

## 2020-01-31 NOTE — Progress Notes (Signed)
Established Patient Office Visit  Subjective:  Patient ID: Philip Donaldson, male    DOB: 1928-05-11  Age: 86 y.o. MRN: [id number]  CC:  Chief Complaint  Patient presents with  . Establish Care    New patient, would like lump on left arm checked.     HPI Philip Donaldson presents for establishment of care follow-up of hypertension and elevated cholesterol.  Soon-to-be 86 years old and lives independently.  Has help with his bills and home maintenance by his daughter who lives in [place].  He is noted to masses on his left arm.  They have been there from months.  They appear to be stable.  There is no injury.  They are not painful.  They have not drained.  Brings in a list of blood pressures that run from 120-140/60-70 on his current regimen.  He is nonfasting right now.  Past Medical History:  Diagnosis Date  . Cancer (HCC)    skin Ca-melanoma- scalp  . Cataract   . Diverticulitis   . Hyperlipidemia   . Hypertension   . Tubular adenoma of colon 05/2011    Past Surgical History:  Procedure Laterality Date  . COLONOSCOPY    . POLYPECTOMY    . SKIN CANCER EXCISION     to scalp    Family History  Problem Relation Age of Onset  . Colon cancer Neg Hx   . Esophageal cancer Neg Hx   . Rectal cancer Neg Hx   . Stomach cancer Neg Hx     Social History   Socioeconomic History  . Marital status: Married    Spouse name: Not on file  . Number of children: 3  . Years of education: Not on file  . Highest education level: Not on file  Occupational History  . Occupation: Retired   Tobacco Use  . Smoking status: Former Research scientist (life sciences)  . Smokeless tobacco: Never Used  Vaping Use  . Vaping Use: Never used  Substance and Sexual Activity  . Alcohol use: No  . Drug use: No  . Sexual activity: Not on file  Other Topics Concern  . Not on file  Social History Narrative   Daily caffeine    Social Determinants of Health   Financial Resource Strain:   . Difficulty of Paying Living  Expenses: Not on file  Food Insecurity:   . Worried About Charity fundraiser in the Last Year: Not on file  . Ran Out of Food in the Last Year: Not on file  Transportation Needs:   . Lack of Transportation (Medical): Not on file  . Lack of Transportation (Non-Medical): Not on file  Physical Activity:   . Days of Exercise per Week: Not on file  . Minutes of Exercise per Session: Not on file  Stress:   . Feeling of Stress : Not on file  Social Connections:   . Frequency of Communication with Friends and Family: Not on file  . Frequency of Social Gatherings with Friends and Family: Not on file  . Attends Religious Services: Not on file  . Active Member of Clubs or Organizations: Not on file  . Attends Archivist Meetings: Not on file  . Marital Status: Not on file  Intimate Partner Violence:   . Fear of Current or Ex-Partner: Not on file  . Emotionally Abused: Not on file  . Physically Abused: Not on file  . Sexually Abused: Not on file    Outpatient Medications Prior  to Visit  Medication Sig Dispense Refill  . amLODipine (NORVASC) 10 MG tablet 0.5 tablets Daily.    . dorzolamide-timolol (COSOPT) 22.3-6.8 MG/ML ophthalmic solution 1 drop 2 (two) times daily.    Marland Kitchen lisinopril-hydrochlorothiazide (PRINZIDE,ZESTORETIC) 20-12.5 MG per tablet 1 tablet Daily.    Marland Kitchen lovastatin (MEVACOR) 40 MG tablet 1 tablet Daily.    . metoprolol (LOPRESSOR) 100 MG tablet 0.5 tablets Twice daily.    . diclofenac Sodium (VOLTAREN) 1 % GEL Apply 4 g topically 4 (four) times daily as needed. (Patient not taking: Reported on 01/31/2020) 500 g 6   No facility-administered medications prior to visit.    Allergies  Allergen Reactions  . Clarithromycin     REACTION: dizziness  . Pneumococcal Vaccines     Unsure- pt was just told not to take this again  . Tuberculin Tests     Pt unsure- just told not to take it again    ROS Review of Systems  Constitutional: Negative.   HENT: Negative.     Eyes: Negative for photophobia and visual disturbance.  Respiratory: Negative.   Cardiovascular: Negative.   Gastrointestinal: Negative.   Endocrine: Negative for polyphagia and polyuria.  Genitourinary: Negative.   Musculoskeletal: Positive for arthralgias.  Neurological: Negative for weakness and numbness.  Hematological: Does not bruise/bleed easily.  Psychiatric/Behavioral: Negative.       Objective:    Physical Exam Vitals and nursing note reviewed.  Constitutional:      General: He is not in acute distress.    Appearance: Normal appearance. He is normal weight. He is not ill-appearing or toxic-appearing.  HENT:     Head: Normocephalic and atraumatic.     Left Ear: External ear normal.  Eyes:     General: No scleral icterus.       Right eye: No discharge.     Extraocular Movements: Extraocular movements intact.     Conjunctiva/sclera: Conjunctivae normal.     Pupils: Pupils are equal, round, and reactive to light.  Cardiovascular:     Rate and Rhythm: Normal rate and regular rhythm.  Pulmonary:     Effort: Pulmonary effort is normal. No respiratory distress.     Breath sounds: Normal breath sounds. No stridor. No wheezing or rhonchi.  Abdominal:     General: Bowel sounds are normal.  Musculoskeletal:       Arms:     Cervical back: No rigidity or tenderness.     Right lower leg: No edema.     Left lower leg: No edema.  Lymphadenopathy:     Cervical: No cervical adenopathy.  Skin:    General: Skin is warm and dry.  Neurological:     Mental Status: He is alert and oriented to person, place, and time.  Psychiatric:        Mood and Affect: Mood normal.        Behavior: Behavior normal.     BP (!) 154/76   Pulse (!) 57   Temp 97.8 F (36.6 C) (Tympanic)   Wt 174 lb 3.2 oz (79 kg)   SpO2 98%   BMI 28.12 kg/m  Wt Readings from Last 3 Encounters:  01/31/20 174 lb 3.2 oz (79 kg)  10/20/13 188 lb (85.3 kg)  05/15/11 187 lb (84.8 kg)     Health  Maintenance Due  Topic Date Due  . TETANUS/TDAP  Never done  . PNA vac Low Risk Adult (1 of 2 - PCV13) Never done    There are no  preventive care reminders to display for this patient.  No results found for: TSH Lab Results  Component Value Date   WBC 5.0 10/17/2009   HGB 14.0 10/17/2009   HCT 40.2 10/17/2009   MCV 93.6 10/17/2009   PLT 158 10/17/2009   Lab Results  Component Value Date   NA 138 10/17/2009   K 3.5 10/17/2009   CO2 29 10/17/2009   GLUCOSE 95 10/17/2009   BUN 14 10/17/2009   CREATININE 1.05 10/17/2009   BILITOT 0.8 10/17/2009   ALKPHOS 50 10/17/2009   AST 24 10/17/2009   ALT 23 10/17/2009   PROT 6.9 10/17/2009   ALBUMIN 3.9 10/17/2009   CALCIUM 9.9 10/17/2009   Lab Results  Component Value Date   CHOL  10/17/2009    129        ATP III CLASSIFICATION:  <200     mg/dL   Desirable  200-239  mg/dL   Borderline High  >=240    mg/dL   High          Lab Results  Component Value Date   HDL 36 (L) 10/17/2009   Lab Results  Component Value Date   LDLCALC  10/17/2009    69        Total Cholesterol/HDL:CHD Risk Coronary Heart Disease Risk Table                     Men   Women  1/2 Average Risk   3.4   3.3  Average Risk       5.0   4.4  2 X Average Risk   9.6   7.1  3 X Average Risk  23.4   11.0        Use the calculated Patient Ratio above and the CHD Risk Table to determine the patient's CHD Risk.        ATP III CLASSIFICATION (LDL):  <100     mg/dL   Optimal  100-129  mg/dL   Near or Above                    Optimal  130-159  mg/dL   Borderline  160-189  mg/dL   High  >190     mg/dL   Very High   Lab Results  Component Value Date   TRIG 119 10/17/2009   Lab Results  Component Value Date   CHOLHDL 3.6 10/17/2009   Lab Results  Component Value Date   HGBA1C (H) 10/17/2009    5.7 (NOTE)                                                                       According to the ADA Clinical Practice Recommendations for 2011, when HbA1c is  used as a screening test:   >=6.5%   Diagnostic of Diabetes Mellitus           (if abnormal result  is confirmed)  5.7-6.4%   Increased risk of developing Diabetes Mellitus  References:Diagnosis and Classification of Diabetes Mellitus,Diabetes EGBT,5176,16(WVPXT 1):S62-S69 and Standards of Medical Care in         Diabetes - 2011,Diabetes Care,2011,34  (Suppl 1):S11-S61.      Assessment & Plan:   Problem  List Items Addressed This Visit      Cardiovascular and Mediastinum   Essential hypertension - Primary   Relevant Orders   CBC   Comprehensive metabolic panel   Urinalysis, Routine w reflex microscopic     Other   Mass of arm, left   Relevant Orders   [place] LT UPPER EXTREM LTD SOFT TISSUE NON VASCULAR   History of gout   Relevant Orders   Uric acid    Other Visit Diagnoses    Elevated LDL cholesterol level       Relevant Orders   LDL cholesterol, direct      No orders of the defined types were placed in this encounter.   Follow-up: Return in about 4 months (around 06/02/2020).    Libby Maw, MD

## 2020-02-01 LAB — URINALYSIS, ROUTINE W REFLEX MICROSCOPIC
Bilirubin Urine: NEGATIVE
Ketones, ur: NEGATIVE
Leukocytes,Ua: NEGATIVE
Nitrite: NEGATIVE
Specific Gravity, Urine: 1.02 (ref 1.000–1.030)
Urine Glucose: NEGATIVE
Urobilinogen, UA: 0.2 (ref 0.0–1.0)
WBC, UA: NONE SEEN (ref 0–?)
pH: 5.5 (ref 5.0–8.0)

## 2020-02-01 LAB — CBC
HCT: 47.8 % (ref 39.0–52.0)
Hemoglobin: 16.4 g/dL (ref 13.0–17.0)
MCHC: 34.2 g/dL (ref 30.0–36.0)
MCV: 93.9 fl (ref 78.0–100.0)
Platelets: 177 10*3/uL (ref 150.0–400.0)
RBC: 5.09 Mil/uL (ref 4.22–5.81)
RDW: 13.1 % (ref 11.5–15.5)
WBC: 10.8 10*3/uL — ABNORMAL HIGH (ref 4.0–10.5)

## 2020-02-01 LAB — COMPREHENSIVE METABOLIC PANEL
ALT: 19 U/L (ref 0–53)
AST: 26 U/L (ref 0–37)
Albumin: 4.8 g/dL (ref 3.5–5.2)
Alkaline Phosphatase: 65 U/L (ref 39–117)
BUN: 17 mg/dL (ref 6–23)
CO2: 28 mEq/L (ref 19–32)
Calcium: 10.5 mg/dL (ref 8.4–10.5)
Chloride: 97 mEq/L (ref 96–112)
Creatinine, Ser: 1.09 mg/dL (ref 0.40–1.50)
GFR: 60.14 mL/min (ref >60.00)
Glucose, Bld: 83 mg/dL (ref 70–99)
Potassium: 4 mEq/L (ref 3.5–5.1)
Sodium: 135 mEq/L (ref 135–145)
Total Bilirubin: 1.1 mg/dL (ref 0.2–1.2)
Total Protein: 7.7 g/dL (ref 6.0–8.3)

## 2020-02-01 LAB — URIC ACID: Uric Acid, Serum: 6.9 mg/dL (ref 4.0–7.8)

## 2020-02-01 LAB — LDL CHOLESTEROL, DIRECT: Direct LDL: 94 mg/dL

## 2020-02-03 ENCOUNTER — Ambulatory Visit
Admission: RE | Admit: 2020-02-03 | Discharge: 2020-02-03 | Disposition: A | Discharge status: Home / Self Care | Payer: Medicare Other | Source: Ambulatory Visit | Attending: Family Medicine | Admitting: Family Medicine

## 2020-02-03 DIAGNOSIS — R2232 Localized swelling, mass and lump, left upper limb: Secondary | ICD-10-CM | POA: Diagnosis not present

## 2020-02-07 ENCOUNTER — Telehealth: Payer: Self-pay | Admitting: Family Medicine

## 2020-02-07 ENCOUNTER — Other Ambulatory Visit: Payer: Self-pay

## 2020-02-07 DIAGNOSIS — I10 Essential (primary) hypertension: Secondary | ICD-10-CM

## 2020-02-07 DIAGNOSIS — Z23 Encounter for immunization: Secondary | ICD-10-CM | POA: Diagnosis not present

## 2020-02-07 DIAGNOSIS — E7800 Pure hypercholesterolemia, unspecified: Secondary | ICD-10-CM

## 2020-02-07 NOTE — Telephone Encounter (Signed)
Caller Name: Butch Call back phone #: [phone]  MEDICATION(S): pt coming due for refills/not yet filled since transferring to Dr. Ethelene Hal - please fill for 90 day supply of each amLODipine (NORVASC) 10 MG tablet  lisinopril-hydrochlorothiazide (PRINZIDE,ZESTORETIC) 20-12.5 MG per tablet  lovastatin (MEVACOR) 40 MG tablet metoprolol (LOPRESSOR) 100 MG tablet   Has the patient contacted their pharmacy? Yes.    - advised to contact provider Preferred Pharmacy:  [id number] - [place], [place] - [address] AT [address] Phone:  [phone]  Fax:  [phone]

## 2020-02-07 NOTE — Telephone Encounter (Signed)
Patients first office visit on 01/31/20 patient calling for refill on pending medications, not sure if doses have change since patients last doctor. Please advise.

## 2020-02-08 ENCOUNTER — Ambulatory Visit: Payer: Medicare Other | Admitting: Family Medicine

## 2020-02-08 MED ORDER — LOVASTATIN 40 MG PO TABS: 40.0000 mg | ORAL_TABLET | Freq: Every day | ORAL | 4 refills | Status: DC

## 2020-02-08 MED ORDER — LISINOPRIL-HYDROCHLOROTHIAZIDE 20-12.5 MG PO TABS: 1.0000 | ORAL_TABLET | Freq: Every day | ORAL | 3 refills | Status: DC

## 2020-02-08 MED ORDER — METOPROLOL TARTRATE 100 MG PO TABS: 50.0000 mg | ORAL_TABLET | Freq: Two times a day (BID) | ORAL | 2 refills | Status: DC

## 2020-02-08 MED ORDER — AMLODIPINE BESYLATE 10 MG PO TABS: 5.0000 mg | ORAL_TABLET | Freq: Every day | ORAL | 2 refills | Status: DC

## 2020-02-09 NOTE — Addendum Note (Signed)
Addended by: Abelino Derrick A on: 02/09/2020 10:15 AM   Modules accepted: Orders

## 2020-02-25 DIAGNOSIS — H401121 Primary open-angle glaucoma, left eye, mild stage: Secondary | ICD-10-CM | POA: Diagnosis not present

## 2020-02-25 DIAGNOSIS — H401112 Primary open-angle glaucoma, right eye, moderate stage: Secondary | ICD-10-CM | POA: Diagnosis not present

## 2020-02-29 ENCOUNTER — Ambulatory Visit
Admission: RE | Admit: 2020-02-29 | Discharge: 2020-02-29 | Disposition: A | Discharge status: Home / Self Care | Payer: Medicare Other | Source: Ambulatory Visit | Attending: Family Medicine | Admitting: Family Medicine

## 2020-02-29 ENCOUNTER — Other Ambulatory Visit: Payer: Self-pay

## 2020-02-29 DIAGNOSIS — R2232 Localized swelling, mass and lump, left upper limb: Secondary | ICD-10-CM

## 2020-02-29 DIAGNOSIS — M19042 Primary osteoarthritis, left hand: Secondary | ICD-10-CM | POA: Diagnosis not present

## 2020-02-29 DIAGNOSIS — M19032 Primary osteoarthritis, left wrist: Secondary | ICD-10-CM | POA: Diagnosis not present

## 2020-02-29 DIAGNOSIS — M65832 Other synovitis and tenosynovitis, left forearm: Secondary | ICD-10-CM | POA: Diagnosis not present

## 2020-02-29 MED ORDER — GADOBENATE DIMEGLUMINE 529 MG/ML IV SOLN
15.0000 mL | Freq: Once | INTRAVENOUS | Status: AC | PRN
  Administered 2020-02-29: 15 mL via INTRAVENOUS

## 2020-03-01 NOTE — Progress Notes (Signed)
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 Taking aleve, ibuprofen can help. Resting the arm/wrist. We can also consider a wrist brace. Arthritis also present

## 2020-04-06 DIAGNOSIS — H353111 Nonexudative age-related macular degeneration, right eye, early dry stage: Secondary | ICD-10-CM | POA: Diagnosis not present

## 2020-04-06 DIAGNOSIS — H401112 Primary open-angle glaucoma, right eye, moderate stage: Secondary | ICD-10-CM | POA: Diagnosis not present

## 2020-04-06 DIAGNOSIS — H401121 Primary open-angle glaucoma, left eye, mild stage: Secondary | ICD-10-CM | POA: Diagnosis not present

## 2020-04-06 DIAGNOSIS — H2513 Age-related nuclear cataract, bilateral: Secondary | ICD-10-CM | POA: Diagnosis not present

## 2020-04-13 ENCOUNTER — Other Ambulatory Visit: Payer: Self-pay

## 2020-04-13 DIAGNOSIS — E7800 Pure hypercholesterolemia, unspecified: Secondary | ICD-10-CM

## 2020-04-13 DIAGNOSIS — I10 Essential (primary) hypertension: Secondary | ICD-10-CM

## 2020-04-13 MED ORDER — LISINOPRIL-HYDROCHLOROTHIAZIDE 20-12.5 MG PO TABS: 1.0000 | ORAL_TABLET | Freq: Every day | ORAL | 1 refills | Status: DC

## 2020-04-13 MED ORDER — AMLODIPINE BESYLATE 10 MG PO TABS: 5.0000 mg | ORAL_TABLET | Freq: Every day | ORAL | 1 refills | Status: DC

## 2020-04-13 MED ORDER — LOVASTATIN 40 MG PO TABS: 40.0000 mg | ORAL_TABLET | Freq: Every day | ORAL | 1 refills | Status: DC

## 2020-04-13 MED ORDER — METOPROLOL TARTRATE 100 MG PO TABS: 50.0000 mg | ORAL_TABLET | Freq: Two times a day (BID) | ORAL | 1 refills | Status: DC

## 2020-04-13 NOTE — Telephone Encounter (Signed)
Received a request from [facility] for pt's meds to be filled. Called and spoke to pt to confirm that he wanted it sent there and he does.  RX sent with 1 refill. Dm/cma

## 2020-06-06 ENCOUNTER — Ambulatory Visit: Payer: Medicare Other | Admitting: Family Medicine

## 2020-06-09 DIAGNOSIS — H401121 Primary open-angle glaucoma, left eye, mild stage: Secondary | ICD-10-CM | POA: Diagnosis not present

## 2020-06-09 DIAGNOSIS — H401112 Primary open-angle glaucoma, right eye, moderate stage: Secondary | ICD-10-CM | POA: Diagnosis not present

## 2020-07-24 ENCOUNTER — Encounter: Payer: Self-pay | Admitting: Family Medicine

## 2020-07-24 ENCOUNTER — Ambulatory Visit (INDEPENDENT_AMBULATORY_CARE_PROVIDER_SITE_OTHER): Payer: Medicare HMO | Admitting: Family Medicine

## 2020-07-24 ENCOUNTER — Other Ambulatory Visit: Payer: Self-pay

## 2020-07-24 VITALS — BP 150/80 | HR 56 | Temp 97.4°F | Ht 66.0 in | Wt 172.2 lb

## 2020-07-24 DIAGNOSIS — I10 Essential (primary) hypertension: Secondary | ICD-10-CM

## 2020-07-24 DIAGNOSIS — E7800 Pure hypercholesterolemia, unspecified: Secondary | ICD-10-CM

## 2020-07-24 LAB — COMPREHENSIVE METABOLIC PANEL
ALT: 21 U/L (ref 0–53)
AST: 26 U/L (ref 0–37)
Albumin: 4.1 g/dL (ref 3.5–5.2)
Alkaline Phosphatase: 56 U/L (ref 39–117)
BUN: 12 mg/dL (ref 6–23)
CO2: 33 mEq/L — ABNORMAL HIGH (ref 19–32)
Calcium: 10.2 mg/dL (ref 8.4–10.5)
Chloride: 101 mEq/L (ref 96–112)
Creatinine, Ser: 1.02 mg/dL (ref 0.40–1.50)
GFR: 64.9 mL/min (ref >60.00)
Glucose, Bld: 78 mg/dL (ref 70–99)
Potassium: 4 mEq/L (ref 3.5–5.1)
Sodium: 139 mEq/L (ref 135–145)
Total Bilirubin: 0.9 mg/dL (ref 0.2–1.2)
Total Protein: 7.1 g/dL (ref 6.0–8.3)

## 2020-07-24 LAB — CBC
HCT: 47.2 % (ref 39.0–52.0)
Hemoglobin: 16 g/dL (ref 13.0–17.0)
MCHC: 34 g/dL (ref 30.0–36.0)
MCV: 94.6 fl (ref 78.0–100.0)
Platelets: 184 10*3/uL (ref 150.0–400.0)
RBC: 4.99 Mil/uL (ref 4.22–5.81)
RDW: 13.5 % (ref 11.5–15.5)
WBC: 7.5 10*3/uL (ref 4.0–10.5)

## 2020-07-24 LAB — LDL CHOLESTEROL, DIRECT: Direct LDL: 82 mg/dL

## 2020-07-24 MED ORDER — AMLODIPINE BESYLATE 10 MG PO TABS: 10.0000 mg | ORAL_TABLET | Freq: Every day | ORAL | 1 refills | Status: DC

## 2020-07-24 NOTE — Progress Notes (Signed)
Established Patient Office Visit  Subjective:  Patient ID: Philip Donaldson, male    DOB: May 07, 1928  Age: 87 y.o. MRN: [id number]  CC:  Chief Complaint  Patient presents with  . Follow-up    Routine follow up on BP and labs, patient would like to go over medication decrease on Amlodipine.     HPI Philip Donaldson presents for follow-up hypertension and elevated cholesterol.  Blood pressure has been running in the 150-160/80-90 range.  Currently taking 5 mg of amlodipine 50 mg of metoprolol tartrate twice daily and 20/12.5 of Zestoretic.  Pulse rate runs in the 50s to 60s.  Continues taking Mevacor for his cholesterol.  Continues to live independently.  He is active around his house.  Otherwise doing well.  Past Medical History:  Diagnosis Date  . Cancer (HCC)    skin Ca-melanoma- scalp  . Cataract   . Diverticulitis   . Hyperlipidemia   . Hypertension   . Tubular adenoma of colon 05/2011    Past Surgical History:  Procedure Laterality Date  . COLONOSCOPY    . POLYPECTOMY    . SKIN CANCER EXCISION     to scalp    Family History  Problem Relation Age of Onset  . Colon cancer Neg Hx   . Esophageal cancer Neg Hx   . Rectal cancer Neg Hx   . Stomach cancer Neg Hx     Social History   Socioeconomic History  . Marital status: Married    Spouse name: Not on file  . Number of children: 3  . Years of education: Not on file  . Highest education level: Not on file  Occupational History  . Occupation: Retired   Tobacco Use  . Smoking status: Former Research scientist (life sciences)  . Smokeless tobacco: Never Used  Vaping Use  . Vaping Use: Never used  Substance and Sexual Activity  . Alcohol use: No  . Drug use: No  . Sexual activity: Not on file  Other Topics Concern  . Not on file  Social History Narrative   Daily caffeine    Social Determinants of Health   Financial Resource Strain: Not on file  Food Insecurity: Not on file  Transportation Needs: Not on file  Physical Activity: Not  on file  Stress: Not on file  Social Connections: Not on file  Intimate Partner Violence: Not on file    Outpatient Medications Prior to Visit  Medication Sig Dispense Refill  . dorzolamide-timolol (COSOPT) 22.3-6.8 MG/ML ophthalmic solution 1 drop 2 (two) times daily.    Marland Kitchen lisinopril-hydrochlorothiazide (ZESTORETIC) 20-12.5 MG tablet Take 1 tablet by mouth daily. 90 tablet 1  . lovastatin (MEVACOR) 40 MG tablet Take 1 tablet (40 mg total) by mouth at bedtime. 90 tablet 1  . metoprolol tartrate (LOPRESSOR) 100 MG tablet Take 0.5 tablets (50 mg total) by mouth 2 (two) times daily. 90 tablet 1  . amLODipine (NORVASC) 10 MG tablet Take 0.5 tablets (5 mg total) by mouth daily. 135 tablet 1  . diclofenac Sodium (VOLTAREN) 1 % GEL Apply 4 g topically 4 (four) times daily as needed. (Patient not taking: No sig reported) 500 g 6   No facility-administered medications prior to visit.    Allergies  Allergen Reactions  . Clarithromycin     REACTION: dizziness  . Pneumococcal Vaccines     Unsure- pt was just told not to take this again  . Tuberculin Tests     Pt unsure- just told not to take  it again    ROS Review of Systems  Constitutional: Negative.   HENT: Negative.   Eyes: Negative for photophobia and visual disturbance.  Respiratory: Negative.  Negative for chest tightness and shortness of breath.   Cardiovascular: Negative.  Negative for chest pain and palpitations.  Gastrointestinal: Negative.   Endocrine: Negative for polyphagia and polyuria.  Musculoskeletal: Negative.   Neurological: Negative for dizziness and light-headedness.  Psychiatric/Behavioral: Negative.       Objective:    Physical Exam Vitals and nursing note reviewed.  Constitutional:      General: He is not in acute distress.    Appearance: Normal appearance. He is not ill-appearing, toxic-appearing or diaphoretic.  HENT:     Head: Normocephalic and atraumatic.     Right Ear: Tympanic membrane, ear canal  and external ear normal.     Left Ear: Tympanic membrane, ear canal and external ear normal.  Eyes:     General: No scleral icterus.       Right eye: No discharge.        Left eye: No discharge.     Extraocular Movements: Extraocular movements intact.     Conjunctiva/sclera: Conjunctivae normal.     Pupils: Pupils are equal, round, and reactive to light.  Cardiovascular:     Rate and Rhythm: Normal rate and regular rhythm.  Pulmonary:     Effort: Pulmonary effort is normal.     Breath sounds: Normal breath sounds.  Musculoskeletal:     Cervical back: No rigidity or tenderness.  Lymphadenopathy:     Cervical: No cervical adenopathy.  Skin:    General: Skin is warm and dry.  Neurological:     Mental Status: He is alert and oriented to person, place, and time.  Psychiatric:        Mood and Affect: Mood normal.        Behavior: Behavior normal.     BP (!) 150/80   Pulse (!) 56   Temp (!) 97.4 F (36.3 C) (Temporal)   Ht 5\' 6"  (1.676 m)   Wt 172 lb 3.2 oz (78.1 kg)   SpO2 98%   BMI 27.79 kg/m  Wt Readings from Last 3 Encounters:  07/24/20 172 lb 3.2 oz (78.1 kg)  01/31/20 174 lb 3.2 oz (79 kg)  10/20/13 188 lb (85.3 kg)     Health Maintenance Due  Topic Date Due  . PNA vac Low Risk Adult (2 of 2 - PCV13) 11/20/1998  . TETANUS/TDAP  06/19/2016    There are no preventive care reminders to display for this patient.  No results found for: TSH Lab Results  Component Value Date   WBC 10.8 (H) 01/31/2020   HGB 16.4 01/31/2020   HCT 47.8 01/31/2020   MCV 93.9 01/31/2020   PLT 177.0 01/31/2020   Lab Results  Component Value Date   NA 135 01/31/2020   K 4.0 01/31/2020   CO2 28 01/31/2020   GLUCOSE 83 01/31/2020   BUN 17 01/31/2020   CREATININE 1.09 01/31/2020   BILITOT 1.1 01/31/2020   ALKPHOS 65 01/31/2020   AST 26 01/31/2020   ALT 19 01/31/2020   PROT 7.7 01/31/2020   ALBUMIN 4.8 01/31/2020   CALCIUM 10.5 01/31/2020   GFR 60.14 01/31/2020   Lab Results   Component Value Date   CHOL  10/17/2009    129        ATP III CLASSIFICATION:  <200     mg/dL   Desirable  200-239  mg/dL   Borderline High  >=240    mg/dL   High          Lab Results  Component Value Date   HDL 36 (L) 10/17/2009   Lab Results  Component Value Date   [facility]  10/17/2009    69        Total Cholesterol/HDL:CHD Risk Coronary Heart Disease Risk Table                     Men   Women  1/2 Average Risk   3.4   3.3  Average Risk       5.0   4.4  2 X Average Risk   9.6   7.1  3 X Average Risk  23.4   11.0        Use the calculated Patient Ratio above and the CHD Risk Table to determine the patient's CHD Risk.        ATP III CLASSIFICATION (LDL):  <100     mg/dL   Optimal  100-129  mg/dL   Near or Above                    Optimal  130-159  mg/dL   Borderline  160-189  mg/dL   High  >190     mg/dL   Very High   Lab Results  Component Value Date   TRIG 119 10/17/2009   Lab Results  Component Value Date   CHOLHDL 3.6 10/17/2009   Lab Results  Component Value Date   HGBA1C (H) 10/17/2009    5.7 (NOTE)                                                                       According to the ADA Clinical Practice Recommendations for 2011, when HbA1c is used as a screening test:   >=6.5%   Diagnostic of Diabetes Mellitus           (if abnormal result  is confirmed)  5.7-6.4%   Increased risk of developing Diabetes Mellitus  References:Diagnosis and Classification of Diabetes Mellitus,Diabetes PYKD,9833,82(NKNLZ 1):S62-S69 and Standards of Medical Care in         Diabetes - 2011,Diabetes Care,2011,34  (Suppl 1):S11-S61.      Assessment & Plan:   Problem List Items Addressed This Visit      Cardiovascular and Mediastinum   Essential hypertension   Relevant Medications   amLODipine (NORVASC) 10 MG tablet   Other Relevant Orders   Comprehensive metabolic panel   CBC    Other Visit Diagnoses    Elevated LDL cholesterol level    -  Primary   Relevant  Orders   LDL cholesterol, direct   Comprehensive metabolic panel      Meds ordered this encounter  Medications  . amLODipine (NORVASC) 10 MG tablet    Sig: Take 1 tablet (10 mg total) by mouth daily.    Dispense:  135 tablet    Refill:  1    Follow-up: Return in about 3 months (around 10/23/2020), or INCREASE AMLODIPINE TO ONE FULL PILL OR 10MG  DAILY. CHECK AND RECORD BPs.Libby Maw, MD

## 2020-09-02 ENCOUNTER — Other Ambulatory Visit: Payer: Self-pay | Admitting: Family Medicine

## 2020-09-02 DIAGNOSIS — I10 Essential (primary) hypertension: Secondary | ICD-10-CM

## 2020-09-02 DIAGNOSIS — E7800 Pure hypercholesterolemia, unspecified: Secondary | ICD-10-CM

## 2020-09-05 ENCOUNTER — Telehealth: Payer: Self-pay | Admitting: Family Medicine

## 2020-09-05 NOTE — Telephone Encounter (Signed)
Pt is saying that he has 7 weeks on all his prescriptions, so he would like to cancel the refill request for the 3 scripts Dr. Ethelene Hal has sent off today. Please advise pt at [phone].

## 2020-09-06 NOTE — Telephone Encounter (Signed)
Medication already mailed out. Patient aware

## 2020-10-20 DIAGNOSIS — H353111 Nonexudative age-related macular degeneration, right eye, early dry stage: Secondary | ICD-10-CM | POA: Diagnosis not present

## 2020-10-20 DIAGNOSIS — H401121 Primary open-angle glaucoma, left eye, mild stage: Secondary | ICD-10-CM | POA: Diagnosis not present

## 2020-10-20 DIAGNOSIS — H401112 Primary open-angle glaucoma, right eye, moderate stage: Secondary | ICD-10-CM | POA: Diagnosis not present

## 2020-10-20 DIAGNOSIS — H2513 Age-related nuclear cataract, bilateral: Secondary | ICD-10-CM | POA: Diagnosis not present

## 2020-10-23 ENCOUNTER — Ambulatory Visit (INDEPENDENT_AMBULATORY_CARE_PROVIDER_SITE_OTHER): Payer: Medicare HMO | Admitting: Family Medicine

## 2020-10-23 ENCOUNTER — Encounter: Payer: Self-pay | Admitting: Family Medicine

## 2020-10-23 ENCOUNTER — Other Ambulatory Visit: Payer: Self-pay

## 2020-10-23 VITALS — BP 156/74 | HR 50 | Temp 97.2°F | Ht 66.0 in | Wt 171.4 lb

## 2020-10-23 DIAGNOSIS — I10 Essential (primary) hypertension: Secondary | ICD-10-CM | POA: Diagnosis not present

## 2020-10-23 MED ORDER — HYDROCHLOROTHIAZIDE 25 MG PO TABS: 25.0000 mg | ORAL_TABLET | Freq: Every day | ORAL | 3 refills | Status: DC

## 2020-10-23 MED ORDER — LISINOPRIL 40 MG PO TABS: 40.0000 mg | ORAL_TABLET | Freq: Every day | ORAL | 3 refills | Status: DC

## 2020-10-23 MED ORDER — LISINOPRIL-HYDROCHLOROTHIAZIDE 20-25 MG PO TABS: 1.0000 | ORAL_TABLET | Freq: Every day | ORAL | 3 refills | Status: DC

## 2020-10-23 NOTE — Progress Notes (Signed)
Established Patient Office Visit  Subjective:  Patient ID: Philip Donaldson, male    DOB: [birth year]  Age: 87 y.o. MRN: [id number]  CC:  Chief Complaint  Patient presents with   Follow-up    3 month follow up on BP and labs. No concerns patient fasting.     HPI Philip Donaldson presents for follow-up of blood pressure status post increasing amlodipine to 10 mg.  Continues with Zestoretic 20/25 mg, and 50 mg metoprolol tartrate twice daily. Bps from home 138-160/60-70s.  Most of the systolic pressures are in the 150 range.  Patient lives alone.  He is responsible for his own medicines.  He does use a pill dispenser.  His daughter checks on him regularly.  He does not smoke, drink alcohol or use illicit drugs.  He has no added salt at the.  Past Medical History:  Diagnosis Date   Cancer ([place])    skin Ca-melanoma- scalp   Cataract    Diverticulitis    Hyperlipidemia    Hypertension    Tubular adenoma of colon 05/2011    Past Surgical History:  Procedure Laterality Date   COLONOSCOPY     POLYPECTOMY     SKIN CANCER EXCISION     to scalp    Family History  Problem Relation Age of Onset   Colon cancer Neg Hx    Esophageal cancer Neg Hx    Rectal cancer Neg Hx    Stomach cancer Neg Hx     Social History   Socioeconomic History   Marital status: Married    Spouse name: Not on file   Number of children: 3   Years of education: Not on file   Highest education level: Not on file  Occupational History   Occupation: Retired   Tobacco Use   Smoking status: Former   Smokeless tobacco: Never  Scientific laboratory technician Use: Never used  Substance and Sexual Activity   Alcohol use: No   Drug use: No   Sexual activity: Not on file  Other Topics Concern   Not on file  Social History Narrative   Daily caffeine    Social Determinants of Health   Financial Resource Strain: Not on file  Food Insecurity: Not on file  Transportation Needs: Not on file  Physical Activity: Not on  file  Stress: Not on file  Social Connections: Not on file  Intimate Partner Violence: Not on file    Outpatient Medications Prior to Visit  Medication Sig Dispense Refill   amLODipine (NORVASC) 10 MG tablet Take 1 tablet (10 mg total) by mouth daily. 135 tablet 1   dorzolamide-timolol (COSOPT) 22.3-6.8 MG/ML ophthalmic solution 1 drop 2 (two) times daily.     latanoprost (XALATAN) 0.005 % ophthalmic solution      lovastatin (MEVACOR) 40 MG tablet TAKE 1 TABLET (40 MG TOTAL) BY MOUTH AT BEDTIME. 90 tablet 1   metoprolol tartrate (LOPRESSOR) 100 MG tablet TAKE 1/2 TABLET TWICE DAILY 90 tablet 1   lisinopril-hydrochlorothiazide (ZESTORETIC) 20-12.5 MG tablet TAKE 1 TABLET EVERY DAY 90 tablet 1   timolol (TIMOPTIC) 0.5 % ophthalmic solution      No facility-administered medications prior to visit.    Allergies  Allergen Reactions   Clarithromycin     REACTION: dizziness   Pneumococcal Vaccines     Unsure- pt was just told not to take this again   Tuberculin Tests     Pt unsure- just told not to take it  again    ROS Review of Systems  Constitutional: Negative.   Respiratory: Negative.    Cardiovascular: Negative.   Gastrointestinal: Negative.   Neurological:  Negative for dizziness and headaches.  Psychiatric/Behavioral: Negative.       Objective:    Physical Exam Vitals and nursing note reviewed.  Constitutional:      Appearance: Normal appearance. He is normal weight.  Eyes:     General: No scleral icterus.       Right eye: No discharge.        Left eye: No discharge.     Conjunctiva/sclera: Conjunctivae normal.  Pulmonary:     Effort: Pulmonary effort is normal.  Skin:    General: Skin is warm and dry.  Neurological:     Mental Status: He is alert and oriented to person, place, and time.  Psychiatric:        Mood and Affect: Mood normal.        Behavior: Behavior normal.    BP (!) 156/74   Pulse (!) 50   Temp (!) 97.2 F (36.2 C) (Temporal)   Ht 5\' 6"   (1.676 m)   Wt 171 lb 6.4 oz (77.7 kg)   SpO2 98%   BMI 27.66 kg/m  Wt Readings from Last 3 Encounters:  10/23/20 171 lb 6.4 oz (77.7 kg)  07/24/20 172 lb 3.2 oz (78.1 kg)  01/31/20 174 lb 3.2 oz (79 kg)     Health Maintenance Due  Topic Date Due   TETANUS/TDAP  Never done   Zoster Vaccines- Shingrix (1 of 2) Never done   PNA vac Low Risk Adult (1 of 2 - PCV13) Never done    There are no preventive care reminders to display for this patient.  No results found for: TSH Lab Results  Component Value Date   WBC 7.5 07/24/2020   HGB 16.0 07/24/2020   HCT 47.2 07/24/2020   MCV 94.6 07/24/2020   PLT 184.0 07/24/2020   Lab Results  Component Value Date   NA 139 07/24/2020   K 4.0 07/24/2020   CO2 33 (H) 07/24/2020   GLUCOSE 78 07/24/2020   BUN 12 07/24/2020   CREATININE 1.02 07/24/2020   BILITOT 0.9 07/24/2020   ALKPHOS 56 07/24/2020   AST 26 07/24/2020   ALT 21 07/24/2020   PROT 7.1 07/24/2020   ALBUMIN 4.1 07/24/2020   CALCIUM 10.2 07/24/2020   GFR 64.90 07/24/2020   Lab Results  Component Value Date   CHOL  10/17/2009    129        ATP III CLASSIFICATION:  <200     mg/dL   Desirable  200-239  mg/dL   Borderline High  >=240    mg/dL   High          Lab Results  Component Value Date   HDL 36 (L) 10/17/2009   Lab Results  Component Value Date   LDLCALC  10/17/2009    69        Total Cholesterol/HDL:CHD Risk Coronary Heart Disease Risk Table                     Men   Women  1/2 Average Risk   3.4   3.3  Average Risk       5.0   4.4  2 X Average Risk   9.6   7.1  3 X Average Risk  23.4   11.0        Use  the calculated Patient Ratio above and the CHD Risk Table to determine the patient's CHD Risk.        ATP III CLASSIFICATION (LDL):  <100     mg/dL   Optimal  100-129  mg/dL   Near or Above                    Optimal  130-159  mg/dL   Borderline  160-189  mg/dL   High  >190     mg/dL   Very High   Lab Results  Component Value Date   TRIG 119  10/17/2009   Lab Results  Component Value Date   CHOLHDL 3.6 10/17/2009   Lab Results  Component Value Date   HGBA1C (H) 10/17/2009    5.7 (NOTE)                                                                       According to the ADA Clinical Practice Recommendations for 2011, when HbA1c is used as a screening test:   >=6.5%   Diagnostic of Diabetes Mellitus           (if abnormal result  is confirmed)  5.7-6.4%   Increased risk of developing Diabetes Mellitus  References:Diagnosis and Classification of Diabetes Mellitus,Diabetes BHAL,9379,02(IOXBD 1):S62-S69 and Standards of Medical Care in         Diabetes - 2011,Diabetes Care,2011,34  (Suppl 1):S11-S61.      Assessment & Plan:   Problem List Items Addressed This Visit       Cardiovascular and Mediastinum   Essential hypertension - Primary   Relevant Medications   lisinopril (ZESTRIL) 40 MG tablet   hydrochlorothiazide (HYDRODIURIL) 25 MG tablet    Meds ordered this encounter  Medications   DISCONTD: lisinopril-hydrochlorothiazide (ZESTORETIC) 20-25 MG tablet    Sig: Take 1 tablet by mouth daily.    Dispense:  90 tablet    Refill:  3   lisinopril (ZESTRIL) 40 MG tablet    Sig: Take 1 tablet (40 mg total) by mouth daily.    Dispense:  90 tablet    Refill:  3   hydrochlorothiazide (HYDRODIURIL) 25 MG tablet    Sig: Take 1 tablet (25 mg total) by mouth daily.    Dispense:  90 tablet    Refill:  3    Follow-up: Return in about 2 months (around 12/24/2020), or Have discontinued zestoretic. Now: Zestril 40mg  and HCTZ 25mg  taken separately..  Check and record blood pressures as before.   Libby Maw, MD

## 2020-11-22 DIAGNOSIS — H2511 Age-related nuclear cataract, right eye: Secondary | ICD-10-CM | POA: Diagnosis not present

## 2020-12-05 DIAGNOSIS — H2511 Age-related nuclear cataract, right eye: Secondary | ICD-10-CM | POA: Diagnosis not present

## 2020-12-05 DIAGNOSIS — H401112 Primary open-angle glaucoma, right eye, moderate stage: Secondary | ICD-10-CM | POA: Diagnosis not present

## 2020-12-08 ENCOUNTER — Telehealth: Payer: Self-pay

## 2020-12-08 NOTE — Telephone Encounter (Signed)
Spoke to patient regarding scheduling an AWV and he declines at this time.  Dm/cma

## 2020-12-14 DIAGNOSIS — H2512 Age-related nuclear cataract, left eye: Secondary | ICD-10-CM | POA: Diagnosis not present

## 2020-12-26 ENCOUNTER — Other Ambulatory Visit: Payer: Self-pay

## 2020-12-26 ENCOUNTER — Encounter: Payer: Self-pay | Admitting: Family Medicine

## 2020-12-26 ENCOUNTER — Ambulatory Visit (INDEPENDENT_AMBULATORY_CARE_PROVIDER_SITE_OTHER): Payer: Medicare HMO | Admitting: Family Medicine

## 2020-12-26 VITALS — BP 136/82 | HR 50 | Temp 97.5°F

## 2020-12-26 DIAGNOSIS — I10 Essential (primary) hypertension: Secondary | ICD-10-CM

## 2020-12-26 DIAGNOSIS — Z23 Encounter for immunization: Secondary | ICD-10-CM

## 2020-12-26 MED ORDER — LISINOPRIL 40 MG PO TABS: 40.0000 mg | ORAL_TABLET | Freq: Every day | ORAL | 3 refills | Status: DC

## 2020-12-26 NOTE — Progress Notes (Signed)
Established Patient Office Visit  Subjective:  Patient ID: Philip Donaldson, male    DOB: 18-Jul-1928  Age: 87 y.o. MRN: [id number]  CC:  Chief Complaint  Patient presents with   Follow-up    2 month follow up would like to discuss medication.     HPI Philip Donaldson presents for follow-up of hypertension.  Brings in multiple blood pressures through his home cuff mostly in the 130s over 60s.  He is having no issues taking these medications.  He denies headaches lightheadedness dizziness.  Continues with amlodipine 10 mg, HCTZ 25 mg, Zestril 40 mg and metoprolol 50 mg twice daily.  He uses a pillbox to help him organize his medicines.  He is cooking his own meals, paying his own bills and keeping up his house.  Of note he is taking Cosopt for glaucoma.  In the process of undergoing surgical correction for his glaucoma and hopefully Cosopt can be discontinued.  Past Medical History:  Diagnosis Date   Cancer ([place])    skin Ca-melanoma- scalp   Cataract    Diverticulitis    Hyperlipidemia    Hypertension    Tubular adenoma of colon 05/2011    Past Surgical History:  Procedure Laterality Date   COLONOSCOPY     POLYPECTOMY     SKIN CANCER EXCISION     to scalp    Family History  Problem Relation Age of Onset   Colon cancer Neg Hx    Esophageal cancer Neg Hx    Rectal cancer Neg Hx    Stomach cancer Neg Hx     Social History   Socioeconomic History   Marital status: Married    Spouse name: Not on file   Number of children: 3   Years of education: Not on file   Highest education level: Not on file  Occupational History   Occupation: Retired   Tobacco Use   Smoking status: Former   Smokeless tobacco: Never  Scientific laboratory technician Use: Never used  Substance and Sexual Activity   Alcohol use: No   Drug use: No   Sexual activity: Not on file  Other Topics Concern   Not on file  Social History Narrative   Daily caffeine    Social Determinants of Health   Financial  Resource Strain: Not on file  Food Insecurity: Not on file  Transportation Needs: Not on file  Physical Activity: Not on file  Stress: Not on file  Social Connections: Not on file  Intimate Partner Violence: Not on file    Outpatient Medications Prior to Visit  Medication Sig Dispense Refill   amLODipine (NORVASC) 10 MG tablet Take 1 tablet (10 mg total) by mouth daily. 135 tablet 1   dorzolamide-timolol (COSOPT) 22.3-6.8 MG/ML ophthalmic solution 1 drop 2 (two) times daily.     hydrochlorothiazide (HYDRODIURIL) 25 MG tablet Take 1 tablet (25 mg total) by mouth daily. 90 tablet 3   latanoprost (XALATAN) 0.005 % ophthalmic solution      lovastatin (MEVACOR) 40 MG tablet TAKE 1 TABLET (40 MG TOTAL) BY MOUTH AT BEDTIME. 90 tablet 1   metoprolol tartrate (LOPRESSOR) 100 MG tablet TAKE 1/2 TABLET TWICE DAILY 90 tablet 1   timolol (TIMOPTIC) 0.5 % ophthalmic solution      lisinopril (ZESTRIL) 40 MG tablet Take 1 tablet (40 mg total) by mouth daily. 90 tablet 3   No facility-administered medications prior to visit.    Allergies  Allergen Reactions  Clarithromycin     REACTION: dizziness   Pneumococcal Vaccines     Unsure- pt was just told not to take this again   Tuberculin Tests     Pt unsure- just told not to take it again    ROS Review of Systems  Constitutional: Negative.   Eyes:  Negative for photophobia and visual disturbance.  Respiratory: Negative.    Cardiovascular: Negative.   Gastrointestinal: Negative.   Neurological:  Negative for dizziness, light-headedness and headaches.  Psychiatric/Behavioral: Negative.       Objective:    Physical Exam Vitals and nursing note reviewed.  Constitutional:      General: He is not in acute distress.    Appearance: Normal appearance. He is normal weight. He is not ill-appearing, toxic-appearing or diaphoretic.  HENT:     Head: Normocephalic and atraumatic.     Right Ear: External ear normal.     Left Ear: External ear  normal.  Eyes:     General: No scleral icterus.       Right eye: No discharge.        Left eye: No discharge.     Extraocular Movements: Extraocular movements intact.     Conjunctiva/sclera: Conjunctivae normal.     Pupils: Pupils are equal, round, and reactive to light.  Cardiovascular:     Rate and Rhythm: Normal rate and regular rhythm.  Pulmonary:     Effort: Pulmonary effort is normal.     Breath sounds: Normal breath sounds.  Abdominal:     General: Bowel sounds are normal.  Musculoskeletal:     Cervical back: No rigidity or tenderness.     Right lower leg: No edema.     Left lower leg: No edema.  Lymphadenopathy:     Cervical: No cervical adenopathy.  Skin:    General: Skin is warm and dry.  Neurological:     Mental Status: He is alert and oriented to person, place, and time.  Psychiatric:        Mood and Affect: Mood normal.        Behavior: Behavior normal.    BP (!) 154/66 (BP Location: Left Arm, Patient Position: Sitting, Cuff Size: Normal)   Pulse (!) 50   Temp (!) 97.5 F (36.4 C) (Temporal)   SpO2 99%  Wt Readings from Last 3 Encounters:  10/23/20 171 lb 6.4 oz (77.7 kg)  07/24/20 172 lb 3.2 oz (78.1 kg)  01/31/20 174 lb 3.2 oz (79 kg)     Health Maintenance Due  Topic Date Due   TETANUS/TDAP  Never done   Zoster Vaccines- Shingrix (1 of 2) Never done   INFLUENZA VACCINE  11/06/2020    There are no preventive care reminders to display for this patient.  No results found for: TSH Lab Results  Component Value Date   WBC 7.5 07/24/2020   HGB 16.0 07/24/2020   HCT 47.2 07/24/2020   MCV 94.6 07/24/2020   PLT 184.0 07/24/2020   Lab Results  Component Value Date   NA 139 07/24/2020   K 4.0 07/24/2020   CO2 33 (H) 07/24/2020   GLUCOSE 78 07/24/2020   BUN 12 07/24/2020   CREATININE 1.02 07/24/2020   BILITOT 0.9 07/24/2020   ALKPHOS 56 07/24/2020   AST 26 07/24/2020   ALT 21 07/24/2020   PROT 7.1 07/24/2020   ALBUMIN 4.1 07/24/2020    CALCIUM 10.2 07/24/2020   GFR 64.90 07/24/2020   Lab Results  Component Value Date  CHOL  10/17/2009    129        ATP III CLASSIFICATION:  <200     mg/dL   Desirable  200-239  mg/dL   Borderline High  >=240    mg/dL   High          Lab Results  Component Value Date   HDL 36 (L) 10/17/2009   Lab Results  Component Value Date   [facility]  10/17/2009    69        Total Cholesterol/HDL:CHD Risk Coronary Heart Disease Risk Table                     Men   Women  1/2 Average Risk   3.4   3.3  Average Risk       5.0   4.4  2 X Average Risk   9.6   7.1  3 X Average Risk  23.4   11.0        Use the calculated Patient Ratio above and the CHD Risk Table to determine the patient's CHD Risk.        ATP III CLASSIFICATION (LDL):  <100     mg/dL   Optimal  100-129  mg/dL   Near or Above                    Optimal  130-159  mg/dL   Borderline  160-189  mg/dL   High  >190     mg/dL   Very High   Lab Results  Component Value Date   TRIG 119 10/17/2009   Lab Results  Component Value Date   CHOLHDL 3.6 10/17/2009   Lab Results  Component Value Date   HGBA1C (H) 10/17/2009    5.7 (NOTE)                                                                       According to the ADA Clinical Practice Recommendations for 2011, when HbA1c is used as a screening test:   >=6.5%   Diagnostic of Diabetes Mellitus           (if abnormal result  is confirmed)  5.7-6.4%   Increased risk of developing Diabetes Mellitus  References:Diagnosis and Classification of Diabetes Mellitus,Diabetes JGGE,3662,94(TMLYY 1):S62-S69 and Standards of Medical Care in         Diabetes - 2011,Diabetes Care,2011,34  (Suppl 1):S11-S61.      Assessment & Plan:   Problem List Items Addressed This Visit       Cardiovascular and Mediastinum   Essential hypertension   Relevant Medications   lisinopril (ZESTRIL) 40 MG tablet   Other Visit Diagnoses     Need for influenza vaccination    -  Primary   Relevant  Orders   Flu Vaccine QUAD High Dose(Fluad)       Meds ordered this encounter  Medications   DISCONTD: lisinopril (ZESTRIL) 40 MG tablet    Sig: Take 1 tablet (40 mg total) by mouth daily.    Dispense:  90 tablet    Refill:  3   lisinopril (ZESTRIL) 40 MG tablet    Sig: Take 1 tablet (40 mg total) by mouth daily.  Dispense:  90 tablet    Refill:  3    Follow-up: Return in about 3 months (around 03/27/2021).   Continue current medications.  Continue checking and recording blood pressures follow-up in 3 months.  Spent over 30 minutes going over patient's medication list and discussing their side effects and his blood pressure results.  Flu vaccine today. Libby Maw, MD

## 2021-01-02 DIAGNOSIS — H268 Other specified cataract: Secondary | ICD-10-CM | POA: Diagnosis not present

## 2021-01-02 DIAGNOSIS — H2512 Age-related nuclear cataract, left eye: Secondary | ICD-10-CM | POA: Diagnosis not present

## 2021-01-02 DIAGNOSIS — H401121 Primary open-angle glaucoma, left eye, mild stage: Secondary | ICD-10-CM | POA: Diagnosis not present

## 2021-01-02 DIAGNOSIS — H401122 Primary open-angle glaucoma, left eye, moderate stage: Secondary | ICD-10-CM | POA: Diagnosis not present

## 2021-01-02 DIAGNOSIS — H401112 Primary open-angle glaucoma, right eye, moderate stage: Secondary | ICD-10-CM | POA: Diagnosis not present

## 2021-01-17 DIAGNOSIS — Z1283 Encounter for screening for malignant neoplasm of skin: Secondary | ICD-10-CM | POA: Diagnosis not present

## 2021-01-17 DIAGNOSIS — Z08 Encounter for follow-up examination after completed treatment for malignant neoplasm: Secondary | ICD-10-CM | POA: Diagnosis not present

## 2021-01-17 DIAGNOSIS — D225 Melanocytic nevi of trunk: Secondary | ICD-10-CM | POA: Diagnosis not present

## 2021-01-17 DIAGNOSIS — Z85828 Personal history of other malignant neoplasm of skin: Secondary | ICD-10-CM | POA: Diagnosis not present

## 2021-01-17 DIAGNOSIS — Z85820 Personal history of malignant melanoma of skin: Secondary | ICD-10-CM | POA: Diagnosis not present

## 2021-01-17 DIAGNOSIS — C44599 Other specified malignant neoplasm of skin of other part of trunk: Secondary | ICD-10-CM | POA: Diagnosis not present

## 2021-01-31 DIAGNOSIS — L988 Other specified disorders of the skin and subcutaneous tissue: Secondary | ICD-10-CM | POA: Diagnosis not present

## 2021-01-31 DIAGNOSIS — C44599 Other specified malignant neoplasm of skin of other part of trunk: Secondary | ICD-10-CM | POA: Diagnosis not present

## 2021-02-19 ENCOUNTER — Other Ambulatory Visit: Payer: Self-pay | Admitting: Family Medicine

## 2021-02-19 DIAGNOSIS — I10 Essential (primary) hypertension: Secondary | ICD-10-CM

## 2021-03-21 DIAGNOSIS — Z85828 Personal history of other malignant neoplasm of skin: Secondary | ICD-10-CM | POA: Diagnosis not present

## 2021-03-21 DIAGNOSIS — Z08 Encounter for follow-up examination after completed treatment for malignant neoplasm: Secondary | ICD-10-CM | POA: Diagnosis not present

## 2021-03-27 ENCOUNTER — Ambulatory Visit: Payer: Medicare HMO | Admitting: Family Medicine

## 2021-03-28 ENCOUNTER — Other Ambulatory Visit: Payer: Self-pay | Admitting: Family Medicine

## 2021-03-28 DIAGNOSIS — I10 Essential (primary) hypertension: Secondary | ICD-10-CM

## 2021-04-05 ENCOUNTER — Other Ambulatory Visit: Payer: Self-pay

## 2021-04-06 ENCOUNTER — Encounter: Payer: Self-pay | Admitting: Family Medicine

## 2021-04-06 ENCOUNTER — Ambulatory Visit (INDEPENDENT_AMBULATORY_CARE_PROVIDER_SITE_OTHER): Payer: Medicare HMO | Admitting: Family Medicine

## 2021-04-06 VITALS — BP 124/72 | HR 68 | Temp 97.7°F | Ht 66.0 in | Wt 168.8 lb

## 2021-04-06 DIAGNOSIS — I10 Essential (primary) hypertension: Secondary | ICD-10-CM

## 2021-04-06 DIAGNOSIS — Z131 Encounter for screening for diabetes mellitus: Secondary | ICD-10-CM

## 2021-04-06 DIAGNOSIS — E7800 Pure hypercholesterolemia, unspecified: Secondary | ICD-10-CM

## 2021-04-06 HISTORY — DX: Pure hypercholesterolemia, unspecified: E78.00

## 2021-04-06 LAB — BASIC METABOLIC PANEL
BUN: 17 mg/dL (ref 6–23)
CO2: 31 mEq/L (ref 19–32)
Calcium: 10.1 mg/dL (ref 8.4–10.5)
Chloride: 102 mEq/L (ref 96–112)
Creatinine, Ser: 1.16 mg/dL (ref 0.40–1.50)
GFR: 55.35 mL/min — ABNORMAL LOW (ref >60.00)
Glucose, Bld: 89 mg/dL (ref 70–99)
Potassium: 4.1 mEq/L (ref 3.5–5.1)
Sodium: 139 mEq/L (ref 135–145)

## 2021-04-06 LAB — HEMOGLOBIN A1C: Hgb A1c MFr Bld: 5.5 % (ref 4.6–6.5)

## 2021-04-06 LAB — LDL CHOLESTEROL, DIRECT: Direct LDL: 79 mg/dL

## 2021-04-06 MED ORDER — LISINOPRIL 40 MG PO TABS: 40.0000 mg | ORAL_TABLET | Freq: Every day | ORAL | 3 refills | Status: DC

## 2021-04-06 MED ORDER — METOPROLOL TARTRATE 100 MG PO TABS: 50.0000 mg | ORAL_TABLET | Freq: Two times a day (BID) | ORAL | 1 refills | Status: DC

## 2021-04-06 MED ORDER — AMLODIPINE BESYLATE 10 MG PO TABS: 10.0000 mg | ORAL_TABLET | Freq: Every day | ORAL | 2 refills | Status: DC

## 2021-04-06 MED ORDER — HYDROCHLOROTHIAZIDE 25 MG PO TABS: 25.0000 mg | ORAL_TABLET | Freq: Every day | ORAL | 3 refills | Status: DC

## 2021-04-06 NOTE — Progress Notes (Signed)
Established Patient Office Visit  Subjective:  Patient ID: Philip Donaldson, male    DOB: July 03, 1928  Age: 87 y.o. MRN: [id number]  CC:  Chief Complaint  Patient presents with   Follow-up    3 month f/u HTN/meds.       HPI STATON MARKEY presents for follow-up of hypertension and elevated cholesterol.  Brings in a list of several blood pressure readings that average about to be around 130/80.  Blood pressure control improved status post separating Zestoretic to Zestril and HCTZ at the higher dose.  Continues with amlodipine and metoprolol.  Continues with lovastatin without issue status post cataract extraction back in September and has done well.  Past Medical History:  Diagnosis Date   Cancer ([place])    skin Ca-melanoma- scalp   Cataract    Diverticulitis    Hyperlipidemia    Hypertension    Tubular adenoma of colon 05/2011    Past Surgical History:  Procedure Laterality Date   COLONOSCOPY     POLYPECTOMY     SKIN CANCER EXCISION     to scalp    Family History  Problem Relation Age of Onset   Colon cancer Neg Hx    Esophageal cancer Neg Hx    Rectal cancer Neg Hx    Stomach cancer Neg Hx     Social History   Socioeconomic History   Marital status: Married    Spouse name: Not on file   Number of children: 3   Years of education: Not on file   Highest education level: Not on file  Occupational History   Occupation: Retired   Tobacco Use   Smoking status: Former   Smokeless tobacco: Never  Scientific laboratory technician Use: Never used  Substance and Sexual Activity   Alcohol use: No   Drug use: No   Sexual activity: Not on file  Other Topics Concern   Not on file  Social History Narrative   Daily caffeine    Social Determinants of Health   Financial Resource Strain: Not on file  Food Insecurity: Not on file  Transportation Needs: Not on file  Physical Activity: Not on file  Stress: Not on file  Social Connections: Not on file  Intimate Partner Violence: Not  on file    Outpatient Medications Prior to Visit  Medication Sig Dispense Refill   lovastatin (MEVACOR) 40 MG tablet TAKE 1 TABLET (40 MG TOTAL) BY MOUTH AT BEDTIME. 90 tablet 1   timolol (TIMOPTIC) 0.5 % ophthalmic solution      amLODipine (NORVASC) 10 MG tablet TAKE 1 TABLET EVERY DAY 90 tablet 0   hydrochlorothiazide (HYDRODIURIL) 25 MG tablet Take 1 tablet (25 mg total) by mouth daily. 90 tablet 3   lisinopril (ZESTRIL) 40 MG tablet Take 1 tablet (40 mg total) by mouth daily. 90 tablet 3   metoprolol tartrate (LOPRESSOR) 100 MG tablet TAKE 1/2 TABLET TWICE DAILY 90 tablet 1   dorzolamide-timolol (COSOPT) 22.3-6.8 MG/ML ophthalmic solution 1 drop 2 (two) times daily.     latanoprost (XALATAN) 0.005 % ophthalmic solution      No facility-administered medications prior to visit.    Allergies  Allergen Reactions   Clarithromycin     REACTION: dizziness   Pneumococcal Vaccines     Unsure- pt was just told not to take this again   Tuberculin Tests     Pt unsure- just told not to take it again    ROS Review of Systems  Constitutional:  Negative for chills, diaphoresis, fatigue, fever and unexpected weight change.  HENT: Negative.    Eyes:  Negative for photophobia and visual disturbance.  Respiratory:  Negative for shortness of breath.   Cardiovascular:  Negative for chest pain.  Gastrointestinal: Negative.   Endocrine: Negative for polyphagia and polyuria.  Genitourinary: Negative.   Musculoskeletal:  Negative for gait problem and joint swelling.  Neurological:  Negative for speech difficulty and weakness.  Psychiatric/Behavioral: Negative.       Objective:    Physical Exam Vitals and nursing note reviewed.  Constitutional:      General: He is not in acute distress.    Appearance: Normal appearance. He is not ill-appearing, toxic-appearing or diaphoretic.  HENT:     Head: Normocephalic and atraumatic.     Right Ear: External ear normal.     Left Ear: External ear  normal.     Mouth/Throat:     Mouth: Mucous membranes are moist.     Pharynx: Oropharynx is clear. No oropharyngeal exudate or posterior oropharyngeal erythema.  Eyes:     General: No scleral icterus.       Right eye: No discharge.        Left eye: No discharge.     Conjunctiva/sclera: Conjunctivae normal.     Pupils: Pupils are equal, round, and reactive to light.  Cardiovascular:     Rate and Rhythm: Normal rate and regular rhythm.  Pulmonary:     Effort: Pulmonary effort is normal.     Breath sounds: Normal breath sounds.  Abdominal:     General: Bowel sounds are normal.  Musculoskeletal:     Cervical back: No rigidity or tenderness.  Lymphadenopathy:     Cervical: No cervical adenopathy.  Skin:    General: Skin is warm and dry.  Neurological:     Mental Status: He is alert and oriented to person, place, and time.  Psychiatric:        Mood and Affect: Mood normal.        Behavior: Behavior normal.    BP 124/72    Pulse 68    Temp 97.7 F (36.5 C) (Temporal)    Ht 5\' 6"  (1.676 m)    Wt 168 lb 12.8 oz (76.6 kg)    SpO2 99%    BMI 27.25 kg/m  Wt Readings from Last 3 Encounters:  04/06/21 168 lb 12.8 oz (76.6 kg)  10/23/20 171 lb 6.4 oz (77.7 kg)  07/24/20 172 lb 3.2 oz (78.1 kg)     Health Maintenance Due  Topic Date Due   Zoster Vaccines- Shingrix (1 of 2) Never done   Pneumonia Vaccine 62+ Years old (1 - PCV) Never done   TETANUS/TDAP  06/19/2016    There are no preventive care reminders to display for this patient.  No results found for: TSH Lab Results  Component Value Date   WBC 7.5 07/24/2020   HGB 16.0 07/24/2020   HCT 47.2 07/24/2020   MCV 94.6 07/24/2020   PLT 184.0 07/24/2020   Lab Results  Component Value Date   NA 139 07/24/2020   K 4.0 07/24/2020   CO2 33 (H) 07/24/2020   GLUCOSE 78 07/24/2020   BUN 12 07/24/2020   CREATININE 1.02 07/24/2020   BILITOT 0.9 07/24/2020   ALKPHOS 56 07/24/2020   AST 26 07/24/2020   ALT 21 07/24/2020    PROT 7.1 07/24/2020   ALBUMIN 4.1 07/24/2020   CALCIUM 10.2 07/24/2020   GFR 64.90 07/24/2020  Lab Results  Component Value Date   CHOL  10/17/2009    129        ATP III CLASSIFICATION:  <200     mg/dL   Desirable  200-239  mg/dL   Borderline High  >=240    mg/dL   High          Lab Results  Component Value Date   HDL 36 (L) 10/17/2009   Lab Results  Component Value Date   [facility]  10/17/2009    69        Total Cholesterol/HDL:CHD Risk Coronary Heart Disease Risk Table                     Men   Women  1/2 Average Risk   3.4   3.3  Average Risk       5.0   4.4  2 X Average Risk   9.6   7.1  3 X Average Risk  23.4   11.0        Use the calculated Patient Ratio above and the CHD Risk Table to determine the patient's CHD Risk.        ATP III CLASSIFICATION (LDL):  <100     mg/dL   Optimal  100-129  mg/dL   Near or Above                    Optimal  130-159  mg/dL   Borderline  160-189  mg/dL   High  >190     mg/dL   Very High   Lab Results  Component Value Date   TRIG 119 10/17/2009   Lab Results  Component Value Date   CHOLHDL 3.6 10/17/2009   Lab Results  Component Value Date   HGBA1C (H) 10/17/2009    5.7 (NOTE)                                                                       According to the ADA Clinical Practice Recommendations for 2011, when HbA1c is used as a screening test:   >=6.5%   Diagnostic of Diabetes Mellitus           (if abnormal result  is confirmed)  5.7-6.4%   Increased risk of developing Diabetes Mellitus  References:Diagnosis and Classification of Diabetes Mellitus,Diabetes IRJJ,8841,66(AYTKZ 1):S62-S69 and Standards of Medical Care in         Diabetes - 2011,Diabetes Care,2011,34  (Suppl 1):S11-S61.      Assessment & Plan:   Problem List Items Addressed This Visit       Cardiovascular and Mediastinum   Essential hypertension - Primary   Relevant Medications   amLODipine (NORVASC) 10 MG tablet   hydrochlorothiazide  (HYDRODIURIL) 25 MG tablet   lisinopril (ZESTRIL) 40 MG tablet   metoprolol tartrate (LOPRESSOR) 100 MG tablet   Other Relevant Orders   Basic metabolic panel     Other   Elevated LDL cholesterol level   Relevant Orders   LDL cholesterol, direct   Other Visit Diagnoses     Screening for diabetes mellitus       Relevant Orders   Hemoglobin A1c       Meds ordered this encounter  Medications  amLODipine (NORVASC) 10 MG tablet    Sig: Take 1 tablet (10 mg total) by mouth daily.    Dispense:  90 tablet    Refill:  2   hydrochlorothiazide (HYDRODIURIL) 25 MG tablet    Sig: Take 1 tablet (25 mg total) by mouth daily.    Dispense:  90 tablet    Refill:  3   lisinopril (ZESTRIL) 40 MG tablet    Sig: Take 1 tablet (40 mg total) by mouth daily.    Dispense:  90 tablet    Refill:  3   metoprolol tartrate (LOPRESSOR) 100 MG tablet    Sig: Take 0.5 tablets (50 mg total) by mouth 2 (two) times daily.    Dispense:  180 tablet    Refill:  1    Follow-up: Return in about 6 months (around 10/05/2021), or if symptoms worsen or fail to improve.    Libby Maw, MD

## 2021-04-08 ENCOUNTER — Other Ambulatory Visit: Payer: Self-pay | Admitting: Family Medicine

## 2021-04-08 DIAGNOSIS — E7800 Pure hypercholesterolemia, unspecified: Secondary | ICD-10-CM

## 2021-04-20 DIAGNOSIS — H353111 Nonexudative age-related macular degeneration, right eye, early dry stage: Secondary | ICD-10-CM | POA: Diagnosis not present

## 2021-04-20 DIAGNOSIS — H401112 Primary open-angle glaucoma, right eye, moderate stage: Secondary | ICD-10-CM | POA: Diagnosis not present

## 2021-04-20 DIAGNOSIS — Z961 Presence of intraocular lens: Secondary | ICD-10-CM | POA: Diagnosis not present

## 2021-04-20 DIAGNOSIS — H401121 Primary open-angle glaucoma, left eye, mild stage: Secondary | ICD-10-CM | POA: Diagnosis not present

## 2021-08-31 ENCOUNTER — Telehealth: Payer: Self-pay | Admitting: Family Medicine

## 2021-08-31 NOTE — Telephone Encounter (Signed)
Left message for patient to call back and schedule Medicare Annual Wellness Visit (AWV).   Please offer to do virtually or by telephone.  Left office number and my jabber #[phone].  AWVI eligible as of  04/08/2009  Please schedule at anytime with Nurse Health Advisor.   

## 2021-09-19 DIAGNOSIS — Z08 Encounter for follow-up examination after completed treatment for malignant neoplasm: Secondary | ICD-10-CM | POA: Diagnosis not present

## 2021-09-19 DIAGNOSIS — B078 Other viral warts: Secondary | ICD-10-CM | POA: Diagnosis not present

## 2021-09-19 DIAGNOSIS — Z85828 Personal history of other malignant neoplasm of skin: Secondary | ICD-10-CM | POA: Diagnosis not present

## 2021-09-25 ENCOUNTER — Telehealth: Payer: Self-pay

## 2021-09-25 ENCOUNTER — Ambulatory Visit: Payer: Medicare HMO

## 2021-09-25 NOTE — Telephone Encounter (Signed)
Called patient x 3 on all available number , Left voice mail message to call to to reschedule for the next available appointment .  L.Alysha Doolan,LPN

## 2021-10-18 DIAGNOSIS — H401112 Primary open-angle glaucoma, right eye, moderate stage: Secondary | ICD-10-CM | POA: Diagnosis not present

## 2021-10-18 DIAGNOSIS — H11131 Conjunctival pigmentations, right eye: Secondary | ICD-10-CM | POA: Diagnosis not present

## 2021-10-18 DIAGNOSIS — H353111 Nonexudative age-related macular degeneration, right eye, early dry stage: Secondary | ICD-10-CM | POA: Diagnosis not present

## 2021-10-18 DIAGNOSIS — H401121 Primary open-angle glaucoma, left eye, mild stage: Secondary | ICD-10-CM | POA: Diagnosis not present

## 2021-10-25 ENCOUNTER — Encounter: Payer: Self-pay | Admitting: Family Medicine

## 2021-10-25 ENCOUNTER — Ambulatory Visit (INDEPENDENT_AMBULATORY_CARE_PROVIDER_SITE_OTHER): Payer: Medicare HMO | Admitting: Family Medicine

## 2021-10-25 VITALS — BP 118/66 | HR 54 | Temp 97.9°F | Ht 66.0 in | Wt 171.6 lb

## 2021-10-25 DIAGNOSIS — Z8739 Personal history of other diseases of the musculoskeletal system and connective tissue: Secondary | ICD-10-CM | POA: Diagnosis not present

## 2021-10-25 DIAGNOSIS — E7800 Pure hypercholesterolemia, unspecified: Secondary | ICD-10-CM | POA: Diagnosis not present

## 2021-10-25 DIAGNOSIS — I10 Essential (primary) hypertension: Secondary | ICD-10-CM | POA: Diagnosis not present

## 2021-10-25 LAB — BASIC METABOLIC PANEL
BUN: 15 mg/dL (ref 6–23)
CO2: 31 mEq/L (ref 19–32)
Calcium: 10 mg/dL (ref 8.4–10.5)
Chloride: 97 mEq/L (ref 96–112)
Creatinine, Ser: 1.21 mg/dL (ref 0.40–1.50)
GFR: 52.41 mL/min — ABNORMAL LOW (ref >60.00)
Glucose, Bld: 91 mg/dL (ref 70–99)
Potassium: 4 mEq/L (ref 3.5–5.1)
Sodium: 135 mEq/L (ref 135–145)

## 2021-10-25 LAB — LIPID PANEL
Cholesterol: 126 mg/dL (ref 0–200)
HDL: 47.9 mg/dL (ref >39.00)
LDL Cholesterol: 58 mg/dL (ref 0–99)
NonHDL: 77.71
Total CHOL/HDL Ratio: 3
Triglycerides: 99 mg/dL (ref 0.0–149.0)
VLDL: 19.8 mg/dL (ref 0.0–40.0)

## 2021-10-25 LAB — URINALYSIS, ROUTINE W REFLEX MICROSCOPIC
Bilirubin Urine: NEGATIVE
Ketones, ur: NEGATIVE
Leukocytes,Ua: NEGATIVE
Nitrite: NEGATIVE
Specific Gravity, Urine: <=1.005 — AB (ref 1.000–1.030)
Total Protein, Urine: NEGATIVE
Urine Glucose: NEGATIVE
Urobilinogen, UA: 0.2 (ref 0.0–1.0)
pH: 7 (ref 5.0–8.0)

## 2021-10-25 LAB — CBC
HCT: 44.7 % (ref 39.0–52.0)
Hemoglobin: 15 g/dL (ref 13.0–17.0)
MCHC: 33.6 g/dL (ref 30.0–36.0)
MCV: 93.8 fl (ref 78.0–100.0)
Platelets: 176 10*3/uL (ref 150.0–400.0)
RBC: 4.77 Mil/uL (ref 4.22–5.81)
RDW: 14.3 % (ref 11.5–15.5)
WBC: 6.6 10*3/uL (ref 4.0–10.5)

## 2021-10-25 LAB — URIC ACID: Uric Acid, Serum: 8.1 mg/dL — ABNORMAL HIGH (ref 4.0–7.8)

## 2021-10-25 NOTE — Progress Notes (Signed)
Established Patient Office Visit  Subjective   Patient ID: Philip Donaldson, male    DOB: Apr 10, 1928  Age: 88 y.o. MRN: [id number]  Chief Complaint  Patient presents with   Follow-up    6 month follow up, no concerns. Patient fating.     HPI follow-up of hypertension, elevated cholesterol and history of gout.  Has had no recent gouty flares.  Blood pressures been well controlled with lisinopril, HCTZ amlodipine and metoprolol.  Continues Mevacor for cholesterol.  Blood pressures at home are running in the 130s over 80s.  Continues to live independently.  His son and daughter have been helping him keep his bills paid.  His daughter checks on them about once a week.  Continues to exercise by walking but is careful to stay out of the heat.    Review of Systems  Constitutional: Negative.   HENT: Negative.    Eyes:  Negative for blurred vision, discharge and redness.  Respiratory: Negative.    Cardiovascular: Negative.   Gastrointestinal:  Negative for abdominal pain.  Genitourinary: Negative.   Musculoskeletal: Negative.  Negative for myalgias.  Skin:  Negative for rash.  Neurological:  Negative for tingling, loss of consciousness and weakness.  Endo/Heme/Allergies:  Negative for polydipsia.  Psychiatric/Behavioral: Negative.        Objective:     BP 118/66 (BP Location: Left Arm, Patient Position: Sitting, Cuff Size: Normal)   Pulse (!) 54   Temp 97.9 F (36.6 C) (Temporal)   Ht '5\' 6"'$  (1.676 m)   Wt 171 lb 9.6 oz (77.8 kg)   SpO2 93%   BMI 27.70 kg/m    Physical Exam Constitutional:      General: He is not in acute distress.    Appearance: Normal appearance. He is not ill-appearing, toxic-appearing or diaphoretic.  HENT:     Head: Normocephalic and atraumatic.     Right Ear: External ear normal.     Left Ear: External ear normal.     Mouth/Throat:     Mouth: Mucous membranes are moist.     Pharynx: Oropharynx is clear. No oropharyngeal exudate or posterior  oropharyngeal erythema.  Eyes:     General: No scleral icterus.       Right eye: No discharge.        Left eye: No discharge.     Extraocular Movements: Extraocular movements intact.     Conjunctiva/sclera: Conjunctivae normal.     Pupils: Pupils are equal, round, and reactive to light.  Cardiovascular:     Rate and Rhythm: Normal rate and regular rhythm.  Pulmonary:     Effort: Pulmonary effort is normal. No respiratory distress.     Breath sounds: Normal breath sounds.  Abdominal:     General: Bowel sounds are normal.     Tenderness: There is no abdominal tenderness. There is no guarding.  Musculoskeletal:     Cervical back: No rigidity or tenderness.  Skin:    General: Skin is warm and dry.  Neurological:     Mental Status: He is alert and oriented to person, place, and time.  Psychiatric:        Mood and Affect: Mood normal.        Behavior: Behavior normal.      No results found for any visits on 10/25/21.    The ASCVD Risk score (Arnett DK, et al., 2019) failed to calculate for the following reasons:   The 2019 ASCVD risk score is only valid for ages  40 to 79    Assessment & Plan:   Problem List Items Addressed This Visit       Cardiovascular and Mediastinum   Essential hypertension - Primary   Relevant Orders   Basic metabolic panel   Urinalysis, Routine w reflex microscopic   CBC     Other   History of gout   Relevant Orders   Uric acid   Elevated LDL cholesterol level   Relevant Orders   Lipid panel    Return in about 6 months (around 04/27/2022).    Libby Maw, MD

## 2021-10-29 ENCOUNTER — Ambulatory Visit: Payer: Medicare HMO | Admitting: Family Medicine

## 2021-11-07 ENCOUNTER — Ambulatory Visit (INDEPENDENT_AMBULATORY_CARE_PROVIDER_SITE_OTHER): Payer: Medicare HMO

## 2021-11-07 DIAGNOSIS — Z0000 Encounter for general adult medical examination without abnormal findings: Secondary | ICD-10-CM

## 2021-11-07 NOTE — Patient Instructions (Signed)
Mr. Philip Donaldson , Thank you for taking time to come for your Medicare Wellness Visit. I appreciate your ongoing commitment to your health goals. Please review the following plan we discussed and let me know if I can assist you in the future.   Screening recommendations/referrals: Colonoscopy: no longer required  Recommended yearly ophthalmology/optometry visit for glaucoma screening and checkup Recommended yearly dental visit for hygiene and checkup  Vaccinations: Influenza vaccine: completed  Pneumococcal vaccine: completed  Tdap vaccine: due  Shingles vaccine: completed     Advanced directives: none   Conditions/risks identified: none   Next appointment: none   Preventive Care 86 Years and Older, Male Preventive care refers to lifestyle choices and visits with your health care provider that can promote health and wellness. What does preventive care include? A yearly physical exam. This is also called an annual well check. Dental exams once or twice a year. Routine eye exams. Ask your health care provider how often you should have your eyes checked. Personal lifestyle choices, including: Daily care of your teeth and gums. Regular physical activity. Eating a healthy diet. Avoiding tobacco and drug use. Limiting alcohol use. Practicing safe sex. Taking low doses of aspirin every day. Taking vitamin and mineral supplements as recommended by your health care provider. What happens during an annual well check? The services and screenings done by your health care provider during your annual well check will depend on your age, overall health, lifestyle risk factors, and family history of disease. Counseling  Your health care provider may ask you questions about your: Alcohol use. Tobacco use. Drug use. Emotional well-being. Home and relationship well-being. Sexual activity. Eating habits. History of falls. Memory and ability to understand (cognition). Work and work  Statistician. Screening  You may have the following tests or measurements: Height, weight, and BMI. Blood pressure. Lipid and cholesterol levels. These may be checked every 5 years, or more frequently if you are over 65 years old. Skin check. Lung cancer screening. You may have this screening every year starting at age 80 if you have a 30-pack-year history of smoking and currently smoke or have quit within the past 15 years. Fecal occult blood test (FOBT) of the stool. You may have this test every year starting at age 32. Flexible sigmoidoscopy or colonoscopy. You may have a sigmoidoscopy every 5 years or a colonoscopy every 10 years starting at age 25. Prostate cancer screening. Recommendations will vary depending on your family history and other risks. Hepatitis C blood test. Hepatitis B blood test. Sexually transmitted disease (STD) testing. Diabetes screening. This is done by checking your blood sugar (glucose) after you have not eaten for a while (fasting). You may have this done every 1-3 years. Abdominal aortic aneurysm (AAA) screening. You may need this if you are a current or former smoker. Osteoporosis. You may be screened starting at age 74 if you are at high risk. Talk with your health care provider about your test results, treatment options, and if necessary, the need for more tests. Vaccines  Your health care provider may recommend certain vaccines, such as: Influenza vaccine. This is recommended every year. Tetanus, diphtheria, and acellular pertussis (Tdap, Td) vaccine. You may need a Td booster every 10 years. Zoster vaccine. You may need this after age 40. Pneumococcal 13-valent conjugate (PCV13) vaccine. One dose is recommended after age 7. Pneumococcal polysaccharide (PPSV23) vaccine. One dose is recommended after age 36. Talk to your health care provider about which screenings and vaccines you need and  how often you need them. This information is not intended to replace  advice given to you by your health care provider. Make sure you discuss any questions you have with your health care provider. Document Released: 04/21/2015 Document Revised: 12/13/2015 Document Reviewed: 01/24/2015 Elsevier Interactive Patient Education  2017 [employer] Prevention in the Home Falls can cause injuries. They can happen to people of all ages. There are many things you can do to make your home safe and to help prevent falls. What can I do on the outside of my home? Regularly fix the edges of walkways and driveways and fix any cracks. Remove anything that might make you trip as you walk through a door, such as a raised step or threshold. Trim any bushes or trees on the path to your home. Use bright outdoor lighting. Clear any walking paths of anything that might make someone trip, such as rocks or tools. Regularly check to see if handrails are loose or broken. Make sure that both sides of any steps have handrails. Any raised decks and porches should have guardrails on the edges. Have any leaves, snow, or ice cleared regularly. Use sand or salt on walking paths during winter. Clean up any spills in your garage right away. This includes oil or grease spills. What can I do in the bathroom? Use night lights. Install grab bars by the toilet and in the tub and shower. Do not use towel bars as grab bars. Use non-skid mats or decals in the tub or shower. If you need to sit down in the shower, use a plastic, non-slip stool. Keep the floor dry. Clean up any water that spills on the floor as soon as it happens. Remove soap buildup in the tub or shower regularly. Attach bath mats securely with double-sided non-slip rug tape. Do not have throw rugs and other things on the floor that can make you trip. What can I do in the bedroom? Use night lights. Make sure that you have a light by your bed that is easy to reach. Do not use any sheets or blankets that are too big for your bed.  They should not hang down onto the floor. Have a firm chair that has side arms. You can use this for support while you get dressed. Do not have throw rugs and other things on the floor that can make you trip. What can I do in the kitchen? Clean up any spills right away. Avoid walking on wet floors. Keep items that you use a lot in easy-to-reach places. If you need to reach something above you, use a strong step stool that has a grab bar. Keep electrical cords out of the way. Do not use floor polish or wax that makes floors slippery. If you must use wax, use non-skid floor wax. Do not have throw rugs and other things on the floor that can make you trip. What can I do with my stairs? Do not leave any items on the stairs. Make sure that there are handrails on both sides of the stairs and use them. Fix handrails that are broken or loose. Make sure that handrails are as long as the stairways. Check any carpeting to make sure that it is firmly attached to the stairs. Fix any carpet that is loose or worn. Avoid having throw rugs at the top or bottom of the stairs. If you do have throw rugs, attach them to the floor with carpet tape. Make sure that you have  a light switch at the top of the stairs and the bottom of the stairs. If you do not have them, ask someone to add them for you. What else can I do to help prevent falls? Wear shoes that: Do not have high heels. Have rubber bottoms. Are comfortable and fit you well. Are closed at the toe. Do not wear sandals. If you use a stepladder: Make sure that it is fully opened. Do not climb a closed stepladder. Make sure that both sides of the stepladder are locked into place. Ask someone to hold it for you, if possible. Clearly mark and make sure that you can see: Any grab bars or handrails. First and last steps. Where the edge of each step is. Use tools that help you move around (mobility aids) if they are needed. These  include: Canes. Walkers. Scooters. Crutches. Turn on the lights when you go into a dark area. Replace any light bulbs as soon as they burn out. Set up your furniture so you have a clear path. Avoid moving your furniture around. If any of your floors are uneven, fix them. If there are any pets around you, be aware of where they are. Review your medicines with your doctor. Some medicines can make you feel dizzy. This can increase your chance of falling. Ask your doctor what other things that you can do to help prevent falls. This information is not intended to replace advice given to you by your health care provider. Make sure you discuss any questions you have with your health care provider. Document Released: 01/19/2009 Document Revised: 08/31/2015 Document Reviewed: 04/29/2014 Elsevier Interactive Patient Education  2017 [employer].

## 2021-11-07 NOTE — Progress Notes (Signed)
Subjective:   Philip Donaldson is a 88 y.o. male who presents for an Initial Medicare Annual Wellness Visit.   I connected with Philip Donaldson  today by telephone and verified that I am speaking with the correct person using two identifiers. Location patient: home Location provider: work Persons participating in the virtual visit: patient, provider.   I discussed the limitations, risks, security and privacy concerns of performing an evaluation and management service by telephone and the availability of in person appointments. I also discussed with the patient that there may be a patient responsible charge related to this service. The patient expressed understanding and verbally consented to this telephonic visit.    Interactive audio and video telecommunications were attempted between this provider and patient, however failed, due to patient having technical difficulties OR patient did not have access to video capability.  We continued and completed visit with audio only.    Review of Systems     Cardiac Risk Factors include: advanced age (>34mn, >>68women);male gender;dyslipidemia;hypertension     Objective:    Today's Vitals   There is no height or weight on file to calculate BMI.     11/07/2021    9:14 AM  Advanced Directives  Does Patient Have a Medical Advance Directive? Yes  Type of AParamedicof [place] will  [place] of [place]? No - copy requested    Current Medications (verified) Outpatient Encounter Medications as of 11/07/2021  Medication Sig   amLODipine (NORVASC) 10 MG tablet Take 1 tablet (10 mg total) by mouth daily.   hydrochlorothiazide (HYDRODIURIL) 25 MG tablet Take 1 tablet (25 mg total) by mouth daily.   lisinopril (ZESTRIL) 40 MG tablet Take 1 tablet (40 mg total) by mouth daily.   lovastatin (MEVACOR) 40 MG tablet TAKE 1 TABLET (40 MG TOTAL) BY MOUTH AT BEDTIME.   metoprolol tartrate (LOPRESSOR)  100 MG tablet Take 0.5 tablets (50 mg total) by mouth 2 (two) times daily.   timolol (TIMOPTIC) 0.5 % ophthalmic solution    No facility-administered encounter medications on file as of 11/07/2021.    Allergies (verified) Clarithromycin, Pneumococcal vaccines, and Tuberculin tests   History: Past Medical History:  Diagnosis Date   [place]    skin Ca-melanoma- scalp   Cataract    Diverticulitis    Hyperlipidemia    Hypertension    Tubular adenoma of colon 05/2011   Past Surgical History:  Procedure Laterality Date   COLONOSCOPY     POLYPECTOMY     SKIN CANCER EXCISION     to scalp   Family History  Problem Relation Age of Onset   Colon cancer Neg Hx    Esophageal cancer Neg Hx    Rectal cancer Neg Hx    Stomach cancer Neg Hx    Social History   Socioeconomic History   Marital status: Married    Spouse name: Not on file   Number of children: 3   Years of education: Not on file   Highest education level: Not on file  Occupational History   Occupation: Retired   Tobacco Use   Smoking status: Former   Smokeless tobacco: Never  VScientific laboratory technicianUse: Never used  Substance and Sexual Activity   Alcohol use: No   Drug use: No   Sexual activity: Not on file  Other Topics Concern   Not on file  Social History Narrative   Daily caffeine  Social Determinants of Health   Financial Resource Strain: Low Risk  (11/07/2021)   Overall Financial Resource Strain (CARDIA)    Difficulty of Paying Living Expenses: Not hard at all  Food Insecurity: No Food Insecurity (11/07/2021)   Hunger Vital Sign    Worried About Running Out of Food in the Last Year: Never true    Ran Out of Food in the Last Year: Never true  Transportation Needs: No Transportation Needs (11/07/2021)   PRAPARE - Hydrologist (Medical): No    Lack of Transportation (Non-Medical): No  Physical Activity: Insufficiently Active (11/07/2021)   Exercise Vital Sign    Days of  Exercise per Week: 5 days    Minutes of Exercise per Session: 20 min  Stress: No Stress Concern Present (11/07/2021)   [place]    Feeling of Stress : Not at all  Social Connections: Moderately Isolated (11/07/2021)   Social Connection and Isolation Panel [NHANES]    Frequency of Communication with Friends and Family: Three times a week    Frequency of Social Gatherings with Friends and Family: Three times a week    Attends Religious Services: 1 to 4 times per year    Active Member of Clubs or Organizations: No    Attends Archivist Meetings: Never    Marital Status: Widowed    Tobacco Counseling Counseling given: Not Answered   Clinical Intake:  Pre-visit preparation completed: Yes  Pain : No/denies pain     Nutritional Risks: None Diabetes: No  How often do you need to have someone help you when you read instructions, pamphlets, or other written materials from your doctor or pharmacy?: 2 - Rarely What is the last grade level you completed in school?: 8th grade  Diabetic?no   Interpreter Needed?: No  Information entered by :: L.Wilson,Philip Donaldson   Activities of Daily Living    11/07/2021    9:14 AM  In your present state of health, do you have any difficulty performing the following activities:  Hearing? 0  Vision? 0  Difficulty concentrating or making decisions? 0  Walking or climbing stairs? 0  Dressing or bathing? 0  Doing errands, shopping? 0  Preparing Food and eating ? N  Using the Toilet? N  In the past six months, have you accidently leaked urine? N  Do you have problems with loss of bowel control? N  Managing your Medications? N  Managing your Finances? N  Housekeeping or managing your Housekeeping? N    Patient Care Team: Philip Maw, MD as PCP - General (Family Medicine)  Indicate any recent Medical Services you may have received from other than [facility] providers in the  past year (date may be approximate).     Assessment:   This is a routine wellness examination for Philip Donaldson.  Hearing/Vision screen Vision Screening - Comments:: Annual eye exams   Dietary issues and exercise activities discussed: Current Exercise Habits: Home exercise routine, Type of exercise: strength training/weights, Time (Minutes): 20, Frequency (Times/Week): 5, Weekly Exercise (Minutes/Week): 100, Intensity: Mild, Exercise limited by: None identified   Goals Addressed   None    Depression Screen    11/07/2021    9:14 AM 11/07/2021    9:13 AM 10/25/2021   10:32 AM 12/26/2020   10:10 AM 10/23/2020   10:05 AM 07/24/2020    9:53 AM 01/31/2020    1:16 PM  PHQ 2/9 Scores  PHQ - 2  Score 0 0 0 0 0 0 0    Fall Risk    11/07/2021    9:15 AM 10/25/2021   10:32 AM 12/26/2020   10:10 AM 10/23/2020   10:05 AM 07/24/2020    9:53 AM  Fall Risk   Falls in the past year? 0 0 0 0 0  Number falls in past yr: 0 0     Injury with Fall? 0      Follow up Falls evaluation completed;Education provided        FALL RISK PREVENTION PERTAINING TO THE HOME:  Any stairs in or around the home? No  If so, are there any without handrails? No  Home free of loose throw rugs in walkways, pet beds, electrical cords, etc? Yes  Adequate lighting in your home to reduce risk of falls? Yes   ASSISTIVE DEVICES UTILIZED TO PREVENT FALLS:  Life alert? No  Use of a cane, walker or w/c? No  Grab bars in the bathroom? No  Shower chair or bench in shower? No  Elevated toilet seat or a handicapped toilet? No     Cognitive Function:    Normal cognitive status assessed by telephone conversation  by this Nurse Health Advisor. No abnormalities found.      11/07/2021    9:16 AM  6CIT Screen  What Year? 0 points  What month? 0 points  What time? 0 points  Count back from 20 0 points  Months in reverse 0 points  Repeat phrase 0 points  Total Score 0 points    Immunizations Immunization History  Administered  Date(s) Administered   Fluad Quad(high Dose 65+) 12/26/2020   Influenza Split 12/20/2008, 12/28/2014, 01/09/2016, 01/11/2017, 01/07/2019   Influenza, High Dose Seasonal PF 01/13/2012   Influenza,inj,Quad PF,6+ Mos 01/13/2012, 01/05/2013   Influenza,trivalent, recombinat, inj, PF 01/05/2011   Influenza-Unspecified 12/07/2013, 01/01/2014, 01/05/2020   Moderna Sars-Covid-2 Vaccination 06/04/2019, 07/02/2019, 02/07/2020   PFIZER(Purple Top)SARS-COV-2 Vaccination 06/04/2019   Pneumococcal Polysaccharide-23 11/19/1997   Td 06/20/2006   Zoster Recombinat (Shingrix) 05/18/2021, 07/18/2021    TDAP status: Due, Education has been provided regarding the importance of this vaccine. Advised may receive this vaccine at local pharmacy or Health Dept. Aware to provide a copy of the vaccination record if obtained from local pharmacy or Health Dept. Verbalized acceptance and understanding.  Flu Vaccine status: Up to date  Pneumococcal vaccine status: Up to date  Covid-19 vaccine status: Completed vaccines  Qualifies for Shingles Vaccine? Yes   Zostavax completed Yes   Shingrix Completed?: Yes  Screening Tests Health Maintenance  Topic Date Due   INFLUENZA VACCINE  11/06/2021   TETANUS/TDAP  10/26/2022 (Originally 06/19/2016)   Zoster Vaccines- Shingrix  Completed   HPV VACCINES  Aged Out   Pneumonia Vaccine 28+ Years old  Discontinued   COVID-19 Vaccine  Discontinued    Health Maintenance  Health Maintenance Due  Topic Date Due   INFLUENZA VACCINE  11/06/2021    Colorectal cancer screening: No longer required.   Lung Cancer Screening: (Low Dose CT Chest recommended if Age 46-80 years, 30 pack-year currently smoking OR have quit w/in 15years.) does not qualify.   Lung Cancer Screening Referral: n/a  Additional Screening:  Hepatitis C Screening: does not qualify;   Vision Screening: Recommended annual ophthalmology exams for early detection of glaucoma and other disorders of the  eye. Is the patient up to date with their annual eye exam?  Yes  Who is the provider or what is the name  of the office in which the patient attends annual eye exams? Dr,Glenn If pt is not established with a provider, would they like to be referred to a provider to establish care? No .   Dental Screening: Recommended annual dental exams for proper oral hygiene  Community Resource Referral / Chronic Care Management: CRR required this visit?  No   CCM required this visit?  No      Plan:     I have personally reviewed and noted the following in the patient's chart:   Medical and social history Use of alcohol, tobacco or illicit drugs  Current medications and supplements including opioid prescriptions. Patient is not currently taking opioid prescriptions. Functional ability and status Nutritional status Physical activity Advanced directives List of other physicians Hospitalizations, surgeries, and ER visits in previous 12 months Vitals Screenings to include cognitive, depression, and falls Referrals and appointments  In addition, I have reviewed and discussed with patient certain preventive protocols, quality metrics, and best practice recommendations. A written personalized care plan for preventive services as well as general preventive health recommendations were provided to patient.     Philip Shepherd, Philip Donaldson   [birth year]   Nurse Notes: none

## 2021-11-14 ENCOUNTER — Other Ambulatory Visit: Payer: Self-pay | Admitting: Family Medicine

## 2021-11-14 DIAGNOSIS — E7800 Pure hypercholesterolemia, unspecified: Secondary | ICD-10-CM

## 2022-01-15 DIAGNOSIS — X32XXXD Exposure to sunlight, subsequent encounter: Secondary | ICD-10-CM | POA: Diagnosis not present

## 2022-01-15 DIAGNOSIS — Z85820 Personal history of malignant melanoma of skin: Secondary | ICD-10-CM | POA: Diagnosis not present

## 2022-01-15 DIAGNOSIS — Z08 Encounter for follow-up examination after completed treatment for malignant neoplasm: Secondary | ICD-10-CM | POA: Diagnosis not present

## 2022-01-15 DIAGNOSIS — L570 Actinic keratosis: Secondary | ICD-10-CM | POA: Diagnosis not present

## 2022-01-15 DIAGNOSIS — D225 Melanocytic nevi of trunk: Secondary | ICD-10-CM | POA: Diagnosis not present

## 2022-01-15 DIAGNOSIS — Z1283 Encounter for screening for malignant neoplasm of skin: Secondary | ICD-10-CM | POA: Diagnosis not present

## 2022-01-16 ENCOUNTER — Other Ambulatory Visit: Payer: Self-pay | Admitting: Family Medicine

## 2022-01-16 DIAGNOSIS — I10 Essential (primary) hypertension: Secondary | ICD-10-CM

## 2022-01-18 DIAGNOSIS — H11131 Conjunctival pigmentations, right eye: Secondary | ICD-10-CM | POA: Diagnosis not present

## 2022-01-18 DIAGNOSIS — H401121 Primary open-angle glaucoma, left eye, mild stage: Secondary | ICD-10-CM | POA: Diagnosis not present

## 2022-01-18 DIAGNOSIS — H401112 Primary open-angle glaucoma, right eye, moderate stage: Secondary | ICD-10-CM | POA: Diagnosis not present

## 2022-01-18 DIAGNOSIS — H353111 Nonexudative age-related macular degeneration, right eye, early dry stage: Secondary | ICD-10-CM | POA: Diagnosis not present

## 2022-01-28 ENCOUNTER — Other Ambulatory Visit: Payer: Self-pay | Admitting: Family Medicine

## 2022-01-28 DIAGNOSIS — I10 Essential (primary) hypertension: Secondary | ICD-10-CM

## 2022-04-02 ENCOUNTER — Other Ambulatory Visit: Payer: Self-pay | Admitting: Family Medicine

## 2022-04-02 DIAGNOSIS — I10 Essential (primary) hypertension: Secondary | ICD-10-CM

## 2022-04-29 ENCOUNTER — Encounter: Payer: Self-pay | Admitting: Family Medicine

## 2022-04-29 ENCOUNTER — Ambulatory Visit (INDEPENDENT_AMBULATORY_CARE_PROVIDER_SITE_OTHER): Payer: Medicare HMO | Admitting: Family Medicine

## 2022-04-29 VITALS — BP 136/70 | HR 50 | Temp 97.2°F | Ht 66.0 in | Wt 173.8 lb

## 2022-04-29 DIAGNOSIS — M79641 Pain in right hand: Secondary | ICD-10-CM

## 2022-04-29 DIAGNOSIS — E7800 Pure hypercholesterolemia, unspecified: Secondary | ICD-10-CM

## 2022-04-29 DIAGNOSIS — Z8739 Personal history of other diseases of the musculoskeletal system and connective tissue: Secondary | ICD-10-CM | POA: Diagnosis not present

## 2022-04-29 DIAGNOSIS — Z23 Encounter for immunization: Secondary | ICD-10-CM | POA: Diagnosis not present

## 2022-04-29 DIAGNOSIS — R001 Bradycardia, unspecified: Secondary | ICD-10-CM

## 2022-04-29 DIAGNOSIS — I10 Essential (primary) hypertension: Secondary | ICD-10-CM | POA: Diagnosis not present

## 2022-04-29 HISTORY — DX: Pain in right hand: M79.641

## 2022-04-29 HISTORY — DX: Bradycardia, unspecified: R00.1

## 2022-04-29 LAB — BASIC METABOLIC PANEL
BUN: 16 mg/dL (ref 6–23)
CO2: 31 mEq/L (ref 19–32)
Calcium: 10.4 mg/dL (ref 8.4–10.5)
Chloride: 99 mEq/L (ref 96–112)
Creatinine, Ser: 1.19 mg/dL (ref 0.40–1.50)
GFR: 53.28 mL/min — ABNORMAL LOW (ref >60.00)
Glucose, Bld: 102 mg/dL — ABNORMAL HIGH (ref 70–99)
Potassium: 4.1 mEq/L (ref 3.5–5.1)
Sodium: 139 mEq/L (ref 135–145)

## 2022-04-29 LAB — URIC ACID: Uric Acid, Serum: 9.1 mg/dL — ABNORMAL HIGH (ref 4.0–7.8)

## 2022-04-29 MED ORDER — DICLOFENAC SODIUM 1 % EX GEL: CUTANEOUS | 2 refills | Status: DC

## 2022-04-29 NOTE — Progress Notes (Signed)
Established Patient Office Visit   Subjective:  Patient ID: Philip Donaldson, male    DOB: [birth year]  Age: 89 y.o. MRN: [id number]  Chief Complaint  Patient presents with   Follow-up    6 month follow up, no concerns. Patient fasting.     HPI Encounter Diagnoses  Name Primary?   Bradycardia Yes   Essential hypertension    Elevated LDL cholesterol level    History of gout    Pain of right hand    Immunization due    Doing well.  Continuing all current medications.  Blood pressure well-controlled with amlodipine 10 mg daily, HCTZ 25 mg daily, Zestril 40 mg daily and 50 of metoprolol or tartrate twice daily.  He denies lightheadedness when standing.  He does experience some shortness of breath with exertion but denies chest pain nausea or diaphoresis.   Review of Systems  Constitutional: Negative.   HENT: Negative.    Eyes:  Negative for blurred vision, discharge and redness.  Respiratory: Negative.    Cardiovascular: Negative.  Negative for chest pain.  Gastrointestinal:  Negative for abdominal pain.  Genitourinary: Negative.   Musculoskeletal: Negative.  Negative for myalgias.  Skin:  Negative for rash.  Neurological:  Negative for dizziness, tingling, loss of consciousness and weakness.  Endo/Heme/Allergies:  Negative for polydipsia.     Current Outpatient Medications:    amLODipine (NORVASC) 10 MG tablet, TAKE 1 TABLET EVERY DAY, Disp: 90 tablet, Rfl: 2   diclofenac Sodium (VOLTAREN) 1 % GEL, Apply a small grape sized dollop to sore joints on pends up to 4 times daily as needed., Disp: 150 g, Rfl: 2   hydrochlorothiazide (HYDRODIURIL) 25 MG tablet, TAKE 1 TABLET EVERY DAY, Disp: 90 tablet, Rfl: 3   lisinopril (ZESTRIL) 40 MG tablet, TAKE 1 TABLET EVERY DAY, Disp: 90 tablet, Rfl: 3   lovastatin (MEVACOR) 40 MG tablet, TAKE 1 TABLET AT BEDTIME, Disp: 90 tablet, Rfl: 1   metoprolol tartrate (LOPRESSOR) 100 MG tablet, TAKE 1/2 TABLET TWICE DAILY, Disp: 90 tablet, Rfl: 3    timolol (TIMOPTIC) 0.5 % ophthalmic solution, , Disp: , Rfl:    Objective:     BP 136/70 (BP Location: Right Arm, Patient Position: Sitting, Cuff Size: Normal)   Pulse (!) 50   Temp (!) 97.2 F (36.2 C) (Temporal)   Ht '5\' 6"'$  (1.676 m)   Wt 173 lb 12.8 oz (78.8 kg)   SpO2 100%   BMI 28.05 kg/m    Physical Exam Constitutional:      General: He is not in acute distress.    Appearance: Normal appearance. He is not ill-appearing, toxic-appearing or diaphoretic.  HENT:     Head: Normocephalic and atraumatic.     Right Ear: External ear normal.     Left Ear: External ear normal.     Mouth/Throat:     Mouth: Mucous membranes are moist.     Pharynx: Oropharynx is clear. No oropharyngeal exudate or posterior oropharyngeal erythema.  Eyes:     General: No scleral icterus.       Right eye: No discharge.        Left eye: No discharge.     Extraocular Movements: Extraocular movements intact.     Conjunctiva/sclera: Conjunctivae normal.     Pupils: Pupils are equal, round, and reactive to light.  Cardiovascular:     Rate and Rhythm: Regular rhythm. Bradycardia present. Occasional Extrasystoles are present. Pulmonary:     Effort: Pulmonary effort is normal. No  respiratory distress.     Breath sounds: Normal breath sounds.  Abdominal:     General: Bowel sounds are normal.     Tenderness: There is no abdominal tenderness. There is no guarding.  Musculoskeletal:     Cervical back: No rigidity or tenderness.  Skin:    General: Skin is warm and dry.  Neurological:     Mental Status: He is alert and oriented to person, place, and time.  Psychiatric:        Mood and Affect: Mood normal.        Behavior: Behavior normal.      No results found for any visits on 04/29/22.    The ASCVD Risk score (Arnett DK, et al., 2019) failed to calculate for the following reasons:   The 2019 ASCVD risk score is only valid for ages 49 to 60    Assessment & Plan:   Bradycardia -     EKG  12-Lead -     Ambulatory referral to Cardiology  Essential hypertension -     Basic metabolic panel  Elevated LDL cholesterol level  History of gout -     Uric acid  Pain of right hand -     Diclofenac Sodium; Apply a small grape sized dollop to sore joints on pends up to 4 times daily as needed.  Dispense: 150 g; Refill: 2  Immunization due -     Tdap vaccine greater than or equal to 7yo IM    Return in about 6 weeks (around 06/10/2022).  EKG revealed ectopic beats that were heard on exam.  Pulse rate was overall higher.  Question of A-fib.  Libby Maw, MD

## 2022-05-29 ENCOUNTER — Other Ambulatory Visit: Payer: Self-pay

## 2022-05-29 DIAGNOSIS — I10 Essential (primary) hypertension: Secondary | ICD-10-CM | POA: Insufficient documentation

## 2022-05-29 DIAGNOSIS — K5792 Diverticulitis of intestine, part unspecified, without perforation or abscess without bleeding: Secondary | ICD-10-CM | POA: Insufficient documentation

## 2022-05-29 DIAGNOSIS — N183 Chronic kidney disease, stage 3 unspecified: Secondary | ICD-10-CM

## 2022-05-29 DIAGNOSIS — E7800 Pure hypercholesterolemia, unspecified: Secondary | ICD-10-CM | POA: Insufficient documentation

## 2022-05-29 DIAGNOSIS — C801 Malignant (primary) neoplasm, unspecified: Secondary | ICD-10-CM | POA: Insufficient documentation

## 2022-05-29 DIAGNOSIS — E785 Hyperlipidemia, unspecified: Secondary | ICD-10-CM | POA: Insufficient documentation

## 2022-05-29 HISTORY — DX: Chronic kidney disease, stage 3 unspecified: N18.30

## 2022-05-29 HISTORY — DX: Pure hypercholesterolemia, unspecified: E78.00

## 2022-06-04 ENCOUNTER — Encounter: Payer: Self-pay | Admitting: Cardiology

## 2022-06-04 ENCOUNTER — Ambulatory Visit: Payer: Medicare HMO | Attending: Cardiology | Admitting: Cardiology

## 2022-06-04 VITALS — BP 144/68 | HR 72 | Ht 66.0 in | Wt 176.0 lb

## 2022-06-04 DIAGNOSIS — I10 Essential (primary) hypertension: Secondary | ICD-10-CM | POA: Diagnosis not present

## 2022-06-04 DIAGNOSIS — I4891 Unspecified atrial fibrillation: Secondary | ICD-10-CM

## 2022-06-04 DIAGNOSIS — E7800 Pure hypercholesterolemia, unspecified: Secondary | ICD-10-CM

## 2022-06-04 DIAGNOSIS — I4819 Other persistent atrial fibrillation: Secondary | ICD-10-CM | POA: Insufficient documentation

## 2022-06-04 HISTORY — DX: Unspecified atrial fibrillation: I48.91

## 2022-06-04 MED ORDER — APIXABAN 5 MG PO TABS: 5.0000 mg | ORAL_TABLET | Freq: Two times a day (BID) | ORAL | 11 refills | Status: DC

## 2022-06-04 MED ORDER — APIXABAN 5 MG PO TABS: 5.0000 mg | ORAL_TABLET | Freq: Two times a day (BID) | ORAL | 0 refills | Status: DC

## 2022-06-04 NOTE — Patient Instructions (Signed)
Medication Instructions:  Your physician has recommended you make the following change in your medication:   Start Eliquis 5 mg twice daily. This is a blood thinner.  *If you need a refill on your cardiac medications before your next appointment, please call your pharmacy*   Lab Work: Your physician recommends that you have labs done in the office today. Your test included  basic metabolic panel, complete blood count and TSH.   You need to do a ifob 1 week after being on Eliquis to make sure you do not have any blood in your stool. If you have labs (blood work) drawn today and your tests are completely normal, you will receive your results only by: [place] (if you have MyChart) OR A paper copy in the mail If you have any lab test that is abnormal or we need to change your treatment, we will call you to review the results.   Testing/Procedures: Your physician has requested that you have an echocardiogram. Echocardiography is a painless test that uses sound waves to create images of your heart. It provides your doctor with information about the size and shape of your heart and how well your heart's chambers and valves are working. This procedure takes approximately one hour. There are no restrictions for this procedure. Please do NOT wear cologne, perfume, aftershave, or lotions (deodorant is allowed). Please arrive 15 minutes prior to your appointment time.     Follow-Up: At [facility], you and your health needs are our priority.  As part of our continuing mission to provide you with exceptional heart care, we have created designated Provider Care Teams.  These Care Teams include your primary Cardiologist (physician) and Advanced Practice Providers (APPs -  Physician Assistants and Nurse Practitioners) who all work together to provide you with the care you need, when you need it.  We recommend signing up for the patient portal called "MyChart".  Sign up information is provided  on this After Visit Summary.  MyChart is used to connect with patients for Virtual Visits (Telemedicine).  Patients are able to view lab/test results, encounter notes, upcoming appointments, etc.  Non-urgent messages can be sent to your provider as well.   To learn more about what you can do with MyChart, go to [email or web address].    Your next appointment:   1 month(s)  The format for your next appointment:   In Person  Provider:   Jyl Heinz, MD   Other Instructions Echocardiogram An echocardiogram is a test that uses sound waves (ultrasound) to produce images of the heart. Images from an echocardiogram can provide important information about: Heart size and shape. The size and thickness and movement of your heart's walls. Heart muscle function and strength. Heart valve function or if you have stenosis. Stenosis is when the heart valves are too narrow. If blood is flowing backward through the heart valves (regurgitation). A tumor or infectious growth around the heart valves. Areas of heart muscle that are not working well because of poor blood flow or injury from a heart attack. Aneurysm detection. An aneurysm is a weak or damaged part of an artery wall. The wall bulges out from the normal force of blood pumping through the body. Tell a health care provider about: Any allergies you have. All medicines you are taking, including vitamins, herbs, eye drops, creams, and over-the-counter medicines. Any blood disorders you have. Any surgeries you have had. Any medical conditions you have. Whether you are pregnant or may be pregnant. What  are the risks? Generally, this is a safe test. However, problems may occur, including an allergic reaction to dye (contrast) that may be used during the test. What happens before the test? No specific preparation is needed. You may eat and drink normally. What happens during the test? You will take off your clothes from the waist up and put  on a hospital gown. Electrodes or electrocardiogram (ECG)patches may be placed on your chest. The electrodes or patches are then connected to a device that monitors your heart rate and rhythm. You will lie down on a table for an ultrasound exam. A gel will be applied to your chest to help sound waves pass through your skin. A handheld device, called a transducer, will be pressed against your chest and moved over your heart. The transducer produces sound waves that travel to your heart and bounce back (or "echo" back) to the transducer. These sound waves will be captured in real-time and changed into images of your heart that can be viewed on a video monitor. The images will be recorded on a computer and reviewed by your health care provider. You may be asked to change positions or hold your breath for a short time. This makes it easier to get different views or better views of your heart. In some cases, you may receive contrast through an IV in one of your veins. This can improve the quality of the pictures from your heart. The procedure may vary among health care providers and hospitals.   What can I expect after the test? You may return to your normal, everyday life, including diet, activities, and medicines, unless your health care provider tells you not to do that. Follow these instructions at home: It is up to you to get the results of your test. Ask your health care provider, or the department that is doing the test, when your results will be ready. Keep all follow-up visits. This is important. Summary An echocardiogram is a test that uses sound waves (ultrasound) to produce images of the heart. Images from an echocardiogram can provide important information about the size and shape of your heart, heart muscle function, heart valve function, and other possible heart problems. You do not need to do anything to prepare before this test. You may eat and drink normally. After the echocardiogram is  completed, you may return to your normal, everyday life, unless your health care provider tells you not to do that. This information is not intended to replace advice given to you by your health care provider. Make sure you discuss any questions you have with your health care provider. Document Revised: 11/16/2019 Document Reviewed: 11/16/2019 Elsevier Patient Education  [place]

## 2022-06-04 NOTE — Addendum Note (Signed)
Addended by: Truddie Hidden on: 06/04/2022 02:30 PM   Modules accepted: Orders

## 2022-06-04 NOTE — Progress Notes (Signed)
Cardiology Office Note:    Date:  06/04/2022   ID:  [name], DOB [birth year], MRN [id number]  PCP:  Libby Maw, MD  Cardiologist:  Jenean Lindau, MD   Referring MD: Libby Maw,*    ASSESSMENT:    1. Essential hypertension   2. Hypercholesterolemia   3. New onset atrial fibrillation (HCC)    PLAN:    In order of problems listed above:  Primary prevention stressed with the patient.  Importance of compliance with diet medication stressed any vocalized understanding.  He was advised to ambulate to the best of his ability. Essential hypertension: Blood pressure stable and diet was emphasized.  Lifestyle modification urged.  Salt intake issues were discussed.  He will keep track of his blood pressure and heart rate log and bring it to me for the next visit in a month. Mixed dyslipidemia: On lipid-lowering medications and diet was emphasized lipids reviewed. Newly diagnosed atrial fibrillation:I discussed with the patient atrial fibrillation, disease process. Management and therapy including rate and rhythm control, anticoagulation benefits and potential risks were discussed extensively with the patient. Patient had multiple questions which were answered to patient's satisfaction.  I will obtain Ifob in a week to make sure he has no issues with bleeding.  He told me that he has not noticed any dark stools or any issues. Cardiac murmur: Echocardiogram will be done to assess murmur heard on auscultation.   Medication Adjustments/Labs and Tests Ordered: Current medicines are reviewed at length with the patient today.  Concerns regarding medicines are outlined above.  No orders of the defined types were placed in this encounter.  No orders of the defined types were placed in this encounter.    History of Present Illness:    Philip Donaldson is a 89 y.o. male who is being seen today for the evaluation of bradycardia and atrial fibrillation at the request of  Libby Maw,*.  Patient is a pleasant 89 year old male.  He has past medical history of essential hypertension and dyslipidemia.  Overall his patient is younger than stated age.  He lives by himself.  He is daughter accompanies him for this visit.  She tells me that his fully independent.  Interestingly patient complains saying that he cannot clean his complete house now because he gets tired doing that.  No chest pain orthopnea or PND.  At the time of my evaluation, the patient is alert awake oriented and in no distress.  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   Cancer ([place])    skin Ca-melanoma- scalp   Chronic kidney disease, stage 3 unspecified ([place]) 05/29/2022   Diverticulitis    Elevated LDL cholesterol level 04/06/2021   Essential hypertension 01/31/2020   Gout 02/02/2012   History of gout 01/31/2020   Hypercholesterolemia 05/29/2022   Hyperlipidemia    Hypertension    Mass of arm, left 01/31/2020   Pain of right hand 04/29/2022   Tubular adenoma of colon 05/10/2011    Past Surgical History:  Procedure Laterality Date   COLONOSCOPY     POLYPECTOMY     SKIN CANCER EXCISION     to scalp    Current Medications: Current Meds  Medication Sig   amLODipine (NORVASC) 10 MG tablet TAKE 1 TABLET EVERY DAY   diclofenac Sodium (VOLTAREN) 1 % GEL Apply a small grape sized dollop to sore joints on pends up to 4 times daily as needed.   hydrochlorothiazide (HYDRODIURIL) 25 MG  tablet TAKE 1 TABLET EVERY DAY   lisinopril (ZESTRIL) 40 MG tablet TAKE 1 TABLET EVERY DAY   lovastatin (MEVACOR) 40 MG tablet TAKE 1 TABLET AT BEDTIME   metoprolol tartrate (LOPRESSOR) 100 MG tablet TAKE 1/2 TABLET TWICE DAILY     Allergies:   Clarithromycin, Indomethacin, Pneumococcal vaccines, and Tuberculin tests   Social History   Socioeconomic History   Marital status: Married    Spouse name: Not on file   Number of children: 3   Years of education: Not on file    Highest education level: Not on file  Occupational History   Occupation: Retired   Tobacco Use   Smoking status: Former   Smokeless tobacco: Never  Scientific laboratory technician Use: Never used  Substance and Sexual Activity   Alcohol use: No   Drug use: No   Sexual activity: Not on file  Other Topics Concern   Not on file  Social History Narrative   Daily caffeine    Social Determinants of Health   Financial Resource Strain: Low Risk  (11/07/2021)   Overall Financial Resource Strain (CARDIA)    Difficulty of Paying Living Expenses: Not hard at all  Food Insecurity: No Food Insecurity (11/07/2021)   Hunger Vital Sign    Worried About Running Out of Food in the Last Year: Never true    Ran Out of Food in the Last Year: Never true  Transportation Needs: No Transportation Needs (11/07/2021)   PRAPARE - Hydrologist (Medical): No    Lack of Transportation (Non-Medical): No  Physical Activity: Insufficiently Active (11/07/2021)   Exercise Vital Sign    Days of Exercise per Week: 5 days    Minutes of Exercise per Session: 20 min  Stress: No Stress Concern Present (11/07/2021)   [place]    Feeling of Stress : Not at all  Social Connections: Moderately Isolated (11/07/2021)   Social Connection and Isolation Panel [NHANES]    Frequency of Communication with Friends and Family: Three times a week    Frequency of Social Gatherings with Friends and Family: Three times a week    Attends Religious Services: 1 to 4 times per year    Active Member of Clubs or Organizations: No    Attends Archivist Meetings: Never    Marital Status: Widowed     Family History: The patient's family history is negative for Colon cancer, Esophageal cancer, Rectal cancer, and Stomach cancer.  ROS:   Please see the history of present illness.    All other systems reviewed and are negative.  EKGs/Labs/Other Studies  Reviewed:    The following studies were reviewed today: EKG reveals atrial fibrillation with well-controlled ventricular rate.   Recent Labs: 10/25/2021: Hemoglobin 15.0; Platelets 176.0 04/29/2022: BUN 16; Creatinine, Ser 1.19; Potassium 4.1; Sodium 139  Recent Lipid Panel    Component Value Date/Time   CHOL 126 10/25/2021 1129   TRIG 99.0 10/25/2021 1129   HDL 47.90 10/25/2021 1129   CHOLHDL 3 10/25/2021 1129   VLDL 19.8 10/25/2021 1129   LDLCALC 58 10/25/2021 1129   LDLDIRECT 79.0 04/06/2021 1103    Physical Exam:    VS:  BP (!) 144/68   Pulse 72   Ht '5\' 6"'$  (1.676 m)   Wt 176 lb 0.6 oz (79.9 kg)   SpO2 97%   BMI 28.41 kg/m     Wt Readings from Last 3 Encounters:  06/04/22 176 lb 0.6 oz (79.9 kg)  04/29/22 173 lb 12.8 oz (78.8 kg)  10/25/21 171 lb 9.6 oz (77.8 kg)     GEN: Patient is in no acute distress HEENT: Normal NECK: No JVD; No carotid bruits LYMPHATICS: No lymphadenopathy CARDIAC: S1 S2 regular, 2/6 systolic murmur at the apex. RESPIRATORY:  Clear to auscultation without rales, wheezing or rhonchi  ABDOMEN: Soft, non-tender, non-distended MUSCULOSKELETAL:  No edema; No deformity  SKIN: Warm and dry NEUROLOGIC:  Alert and oriented x 3 PSYCHIATRIC:  Normal affect    Signed, Jenean Lindau, MD  06/04/2022 2:03 PM    [place]

## 2022-06-05 DIAGNOSIS — I4891 Unspecified atrial fibrillation: Secondary | ICD-10-CM | POA: Diagnosis not present

## 2022-06-05 LAB — CBC
Hematocrit: 47.8 % (ref 37.5–51.0)
Hemoglobin: 16.4 g/dL (ref 13.0–17.7)
MCH: 31.7 pg (ref 26.6–33.0)
MCHC: 34.3 g/dL (ref 31.5–35.7)
MCV: 92 fL (ref 79–97)
Platelets: 170 10*3/uL (ref 150–450)
RBC: 5.18 x10E6/uL (ref 4.14–5.80)
RDW: 13.1 % (ref 11.6–15.4)
WBC: 7.8 10*3/uL (ref 3.4–10.8)

## 2022-06-05 LAB — BASIC METABOLIC PANEL
BUN/Creatinine Ratio: 16 (ref 10–24)
BUN: 17 mg/dL (ref 10–36)
CO2: 23 mmol/L (ref 20–29)
Calcium: 10.4 mg/dL — ABNORMAL HIGH (ref 8.6–10.2)
Chloride: 99 mmol/L (ref 96–106)
Creatinine, Ser: 1.09 mg/dL (ref 0.76–1.27)
Glucose: 81 mg/dL (ref 70–99)
Potassium: 4.3 mmol/L (ref 3.5–5.2)
Sodium: 140 mmol/L (ref 134–144)
eGFR: 64 mL/min/{1.73_m2} (ref >59)

## 2022-06-05 LAB — TSH: TSH: 3.94 u[IU]/mL (ref 0.450–4.500)

## 2022-06-08 LAB — FECAL OCCULT BLOOD, IMMUNOCHEMICAL: Fecal Occult Bld: NEGATIVE

## 2022-06-10 ENCOUNTER — Telehealth: Payer: Self-pay

## 2022-06-10 ENCOUNTER — Ambulatory Visit (INDEPENDENT_AMBULATORY_CARE_PROVIDER_SITE_OTHER): Payer: Medicare HMO | Admitting: Family Medicine

## 2022-06-10 ENCOUNTER — Encounter: Payer: Self-pay | Admitting: Family Medicine

## 2022-06-10 VITALS — BP 128/82 | HR 52 | Temp 98.4°F | Ht 66.0 in | Wt 178.0 lb

## 2022-06-10 DIAGNOSIS — R001 Bradycardia, unspecified: Secondary | ICD-10-CM

## 2022-06-10 DIAGNOSIS — Z8739 Personal history of other diseases of the musculoskeletal system and connective tissue: Secondary | ICD-10-CM

## 2022-06-10 DIAGNOSIS — I10 Essential (primary) hypertension: Secondary | ICD-10-CM | POA: Diagnosis not present

## 2022-06-10 DIAGNOSIS — I4891 Unspecified atrial fibrillation: Secondary | ICD-10-CM

## 2022-06-10 NOTE — Progress Notes (Signed)
Established Patient Office Visit   Subjective:  Patient ID: Philip Donaldson, male    DOB: 09/20/1928  Age: 89 y.o. MRN: [id number]  Chief Complaint  Patient presents with   Follow-up    6 week F/U    HPI Encounter Diagnoses  Name Primary?   Bradycardia Yes   Essential hypertension    Atrial fibrillation, unspecified type ([place])    History of gout    For follow-up of above.  Now carries the diagnosis of atrial fibrillation.  Bradycardia persists.  Denies lightheadedness unless he has been bent over for a long period of time and then stands up straight.  It has not been a problem.  Was started on Eliquis.  Denies blood in his stool or urine.  Has not had a gouty flare in years and years.   Review of Systems  Constitutional: Negative.   HENT: Negative.    Eyes:  Negative for blurred vision, discharge and redness.  Respiratory: Negative.    Cardiovascular: Negative.   Gastrointestinal:  Negative for abdominal pain.  Genitourinary: Negative.   Musculoskeletal: Negative.  Negative for myalgias.  Skin:  Negative for rash.  Neurological:  Negative for tingling, loss of consciousness and weakness.  Endo/Heme/Allergies:  Negative for polydipsia.     Current Outpatient Medications:    amLODipine (NORVASC) 10 MG tablet, TAKE 1 TABLET EVERY DAY, Disp: 90 tablet, Rfl: 2   apixaban (ELIQUIS) 5 MG TABS tablet, Take 1 tablet (5 mg total) by mouth 2 (two) times daily., Disp: 60 tablet, Rfl: 11   diclofenac Sodium (VOLTAREN) 1 % GEL, Apply a small grape sized dollop to sore joints on pends up to 4 times daily as needed., Disp: 150 g, Rfl: 2   hydrochlorothiazide (HYDRODIURIL) 25 MG tablet, TAKE 1 TABLET EVERY DAY, Disp: 90 tablet, Rfl: 3   lisinopril (ZESTRIL) 40 MG tablet, TAKE 1 TABLET EVERY DAY, Disp: 90 tablet, Rfl: 3   lovastatin (MEVACOR) 40 MG tablet, TAKE 1 TABLET AT BEDTIME, Disp: 90 tablet, Rfl: 1   metoprolol tartrate (LOPRESSOR) 100 MG tablet, TAKE 1/2 TABLET TWICE DAILY, Disp:  90 tablet, Rfl: 3   Objective:     BP 128/82 (BP Location: Left Arm, Patient Position: Sitting, Cuff Size: Large)   Pulse (!) 52   Temp 98.4 F (36.9 C) (Oral)   Ht '5\' 6"'$  (1.676 m)   Wt 178 lb (80.7 kg)   SpO2 96%   BMI 28.73 kg/m    Physical Exam Constitutional:      General: He is not in acute distress.    Appearance: Normal appearance. He is not ill-appearing, toxic-appearing or diaphoretic.  HENT:     Head: Normocephalic and atraumatic.     Right Ear: External ear normal.     Left Ear: External ear normal.  Eyes:     General: No scleral icterus.       Right eye: No discharge.        Left eye: No discharge.     Extraocular Movements: Extraocular movements intact.     Conjunctiva/sclera: Conjunctivae normal.  Cardiovascular:     Rate and Rhythm: Rhythm irregularly irregular.  Pulmonary:     Effort: Pulmonary effort is normal. No respiratory distress.     Breath sounds: Normal breath sounds. No wheezing or rales.  Skin:    General: Skin is warm and dry.  Neurological:     Mental Status: He is alert and oriented to person, place, and time.  Psychiatric:  Mood and Affect: Mood normal.        Behavior: Behavior normal.      No results found for any visits on 06/10/22.    The ASCVD Risk score (Arnett DK, et al., 2019) failed to calculate for the following reasons:   The 2019 ASCVD risk score is only valid for ages 62 to 52    Assessment & Plan:   Bradycardia  Essential hypertension  Atrial fibrillation, unspecified type ([place])  History of gout    Return in about 3 months (around 09/10/2022).  Continue current medications follow-up with cardiology for scheduled echocardiogram.  Libby Maw, MD

## 2022-06-10 NOTE — Telephone Encounter (Signed)
Eliquis pt assistance paperwork completed and faxed.

## 2022-07-01 ENCOUNTER — Ambulatory Visit (HOSPITAL_COMMUNITY): Payer: Medicare HMO | Attending: Cardiology

## 2022-07-01 ENCOUNTER — Other Ambulatory Visit: Payer: Self-pay | Admitting: Family Medicine

## 2022-07-01 DIAGNOSIS — E7800 Pure hypercholesterolemia, unspecified: Secondary | ICD-10-CM

## 2022-07-01 DIAGNOSIS — I4891 Unspecified atrial fibrillation: Secondary | ICD-10-CM | POA: Insufficient documentation

## 2022-07-01 LAB — ECHOCARDIOGRAM COMPLETE: S' Lateral: 2.9 cm

## 2022-07-11 ENCOUNTER — Encounter: Payer: Self-pay | Admitting: Cardiology

## 2022-07-11 ENCOUNTER — Ambulatory Visit: Payer: Medicare HMO | Attending: Cardiology | Admitting: Cardiology

## 2022-07-11 VITALS — BP 142/74 | HR 53 | Ht 66.0 in | Wt 176.0 lb

## 2022-07-11 DIAGNOSIS — I10 Essential (primary) hypertension: Secondary | ICD-10-CM | POA: Diagnosis not present

## 2022-07-11 DIAGNOSIS — I4891 Unspecified atrial fibrillation: Secondary | ICD-10-CM

## 2022-07-11 DIAGNOSIS — E7800 Pure hypercholesterolemia, unspecified: Secondary | ICD-10-CM

## 2022-07-11 NOTE — Patient Instructions (Signed)
Medication Instructions:  Your physician recommends that you continue on your current medications as directed. Please refer to the Current Medication list given to you today.  *If you need a refill on your cardiac medications before your next appointment, please call your pharmacy*   Lab Work: None ordered If you have labs (blood work) drawn today and your tests are completely normal, you will receive your results only by: MyChart Message (if you have MyChart) OR A paper copy in the mail If you have any lab test that is abnormal or we need to change your treatment, we will call you to review the results.   Testing/Procedures: None ordered   Follow-Up: At [facility], you and your health needs are our priority.  As part of our continuing mission to provide you with exceptional heart care, we have created designated Provider Care Teams.  These Care Teams include your primary Cardiologist (physician) and Advanced Practice Providers (APPs -  Physician Assistants and Nurse Practitioners) who all work together to provide you with the care you need, when you need it.  We recommend signing up for the patient portal called "MyChart".  Sign up information is provided on this After Visit Summary.  MyChart is used to connect with patients for Virtual Visits (Telemedicine).  Patients are able to view lab/test results, encounter notes, upcoming appointments, etc.  Non-urgent messages can be sent to your provider as well.   To learn more about what you can do with MyChart, go to [email or web address].    Your next appointment:   6 month(s)  The format for your next appointment:   In Person  Provider:   Rajan Revankar, MD    Other Instructions none  Important Information About Sugar       

## 2022-07-11 NOTE — Progress Notes (Signed)
Cardiology Office Note:    Date:  07/11/2022   ID:  Philip Donaldson, DOB 1928/07/17, MRN [id number]  PCP:  Libby Maw, MD  Cardiologist:  Jenean Lindau, MD   Referring MD: Libby Maw,*    ASSESSMENT:    1. New onset atrial fibrillation   2. Essential hypertension   3. Hypercholesterolemia    PLAN:    In order of problems listed above:  Primary prevention stressed with the patient.  Importance of compliance with diet medication stressed and vocalized understanding. Essential hypertension: Patient has brought blood pressure readings from home and they are stable.  I would prefer not to be aggressive with blood pressure lowering in view of age and risk factors and propensity for falls.  Currently his gait is stable. Persistent atrial fibrillation:I discussed with the patient atrial fibrillation, disease process. Management and therapy including rate and rhythm control, anticoagulation benefits and potential risks were discussed extensively with the patient. Patient had multiple questions which were answered to patient's satisfaction. Mixed dyslipidemia: On lipid-lowering medications followed by primary care. Patient will be seen in follow-up appointment in 6 months or earlier if the patient has any concerns.    Medication Adjustments/Labs and Tests Ordered: Current medicines are reviewed at length with the patient today.  Concerns regarding medicines are outlined above.  Orders Placed This Encounter  Procedures   EKG 12-Lead   No orders of the defined types were placed in this encounter.    No chief complaint on file.    History of Present Illness:    Philip Donaldson is a 89 y.o. male.  Patient has past medical history of persistent atrial fibrillation, essential hypertension and mixed dyslipidemia.  He denies any problems at this time and takes care of activities of daily living.  No chest pain orthopnea or PND.  At the time of my evaluation, the  patient is alert awake oriented and in no distress.  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   Cancer    skin Ca-melanoma- scalp   Chronic kidney disease, stage 3 unspecified 05/29/2022   Diverticulitis    Elevated LDL cholesterol level 04/06/2021   Essential hypertension 01/31/2020   Gout 02/02/2012   History of gout 01/31/2020   Hypercholesterolemia 05/29/2022   Hyperlipidemia    Hypertension    Mass of arm, left 01/31/2020   New onset atrial fibrillation 06/04/2022   Pain of right hand 04/29/2022   Tubular adenoma of colon 05/10/2011    Past Surgical History:  Procedure Laterality Date   COLONOSCOPY     POLYPECTOMY     SKIN CANCER EXCISION     to scalp    Current Medications: Current Meds  Medication Sig   amLODipine (NORVASC) 10 MG tablet TAKE 1 TABLET EVERY DAY   apixaban (ELIQUIS) 5 MG TABS tablet Take 1 tablet (5 mg total) by mouth 2 (two) times daily.   diclofenac Sodium (VOLTAREN) 1 % GEL Apply a small grape sized dollop to sore joints on pends up to 4 times daily as needed.   hydrochlorothiazide (HYDRODIURIL) 25 MG tablet TAKE 1 TABLET EVERY DAY   lisinopril (ZESTRIL) 40 MG tablet TAKE 1 TABLET EVERY DAY   lovastatin (MEVACOR) 40 MG tablet TAKE 1 TABLET AT BEDTIME   metoprolol tartrate (LOPRESSOR) 100 MG tablet TAKE 1/2 TABLET TWICE DAILY     Allergies:   Clarithromycin, Indomethacin, Pneumococcal vaccines, and Tuberculin tests   Social History   Socioeconomic History  Marital status: Married    Spouse name: Not on file   Number of children: 3   Years of education: Not on file   Highest education level: Not on file  Occupational History   Occupation: Retired   Tobacco Use   Smoking status: Former   Smokeless tobacco: Never  Scientific laboratory technician Use: Never used  Substance and Sexual Activity   Alcohol use: No   Drug use: No   Sexual activity: Not on file  Other Topics Concern   Not on file  Social History Narrative    Daily caffeine    Social Determinants of Health   Financial Resource Strain: Low Risk  (11/07/2021)   Overall Financial Resource Strain (CARDIA)    Difficulty of Paying Living Expenses: Not hard at all  Food Insecurity: No Food Insecurity (11/07/2021)   Hunger Vital Sign    Worried About Running Out of Food in the Last Year: Never true    [place] in the Last Year: Never true  Transportation Needs: No Transportation Needs (11/07/2021)   PRAPARE - Hydrologist (Medical): No    Lack of Transportation (Non-Medical): No  Physical Activity: Insufficiently Active (11/07/2021)   Exercise Vital Sign    Days of Exercise per Week: 5 days    Minutes of Exercise per Session: 20 min  Stress: No Stress Concern Present (11/07/2021)   [place]    Feeling of Stress : Not at all  Social Connections: Moderately Isolated (11/07/2021)   Social Connection and Isolation Panel [NHANES]    Frequency of Communication with Friends and Family: Three times a week    Frequency of Social Gatherings with Friends and Family: Three times a week    Attends Religious Services: 1 to 4 times per year    Active Member of Clubs or Organizations: No    Attends Archivist Meetings: Never    Marital Status: Widowed     Family History: The patient's family history is negative for Colon cancer, Esophageal cancer, Rectal cancer, and Stomach cancer.  ROS:   Please see the history of present illness.    All other systems reviewed and are negative.  EKGs/Labs/Other Studies Reviewed:    The following studies were reviewed today: EKG shows atrial fibrillation with well-controlled ventricular rate poor anterior forces.   Recent Labs: 06/04/2022: BUN 17; Creatinine, Ser 1.09; Hemoglobin 16.4; Platelets 170; Potassium 4.3; Sodium 140; TSH 3.940  Recent Lipid Panel    Component Value Date/Time   CHOL 126 10/25/2021 1129    TRIG 99.0 10/25/2021 1129   HDL 47.90 10/25/2021 1129   CHOLHDL 3 10/25/2021 1129   VLDL 19.8 10/25/2021 1129   LDLCALC 58 10/25/2021 1129   LDLDIRECT 79.0 04/06/2021 1103    Physical Exam:    VS:  BP (!) 142/74   Pulse (!) 53   Ht 5\' 6"  (1.676 m)   Wt 176 lb 0.6 oz (79.9 kg)   SpO2 98%   BMI 28.41 kg/m     Wt Readings from Last 3 Encounters:  07/11/22 176 lb 0.6 oz (79.9 kg)  06/10/22 178 lb (80.7 kg)  06/04/22 176 lb 0.6 oz (79.9 kg)     GEN: Patient is in no acute distress HEENT: Normal NECK: No JVD; No carotid bruits LYMPHATICS: No lymphadenopathy CARDIAC: Hear sounds regular, 2/6 systolic murmur at the apex. RESPIRATORY:  Clear to auscultation without rales, wheezing  or rhonchi  ABDOMEN: Soft, non-tender, non-distended MUSCULOSKELETAL:  No edema; No deformity  SKIN: Warm and dry NEUROLOGIC:  Alert and oriented x 3 PSYCHIATRIC:  Normal affect   Signed, Jenean Lindau, MD  07/11/2022 11:52 AM    [place]

## 2022-07-25 DIAGNOSIS — H401121 Primary open-angle glaucoma, left eye, mild stage: Secondary | ICD-10-CM | POA: Diagnosis not present

## 2022-07-25 DIAGNOSIS — H401112 Primary open-angle glaucoma, right eye, moderate stage: Secondary | ICD-10-CM | POA: Diagnosis not present

## 2022-10-14 IMAGING — US US EXTREM UP*L* LTD
1 series · 14 of 14 positions shown · non-contrast
Comparison: None.

CLINICAL DATA: Mass along the dorsal aspect of the left arm and
wrist.

EXAM:
ULTRASOUND LEFT UPPER EXTREMITY LIMITED
TECHNIQUE: Ultrasound examination of the upper extremity soft tissues was
performed in the area of clinical concern.

[Series 1: us extrem up*left* ltd · 0.05mm/px · 14 of 14 slices shown]
[im 1/14]
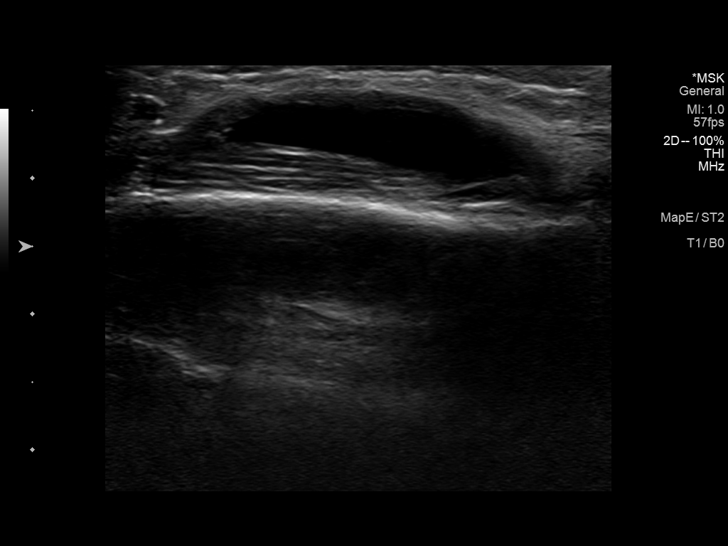
[im 2/14]
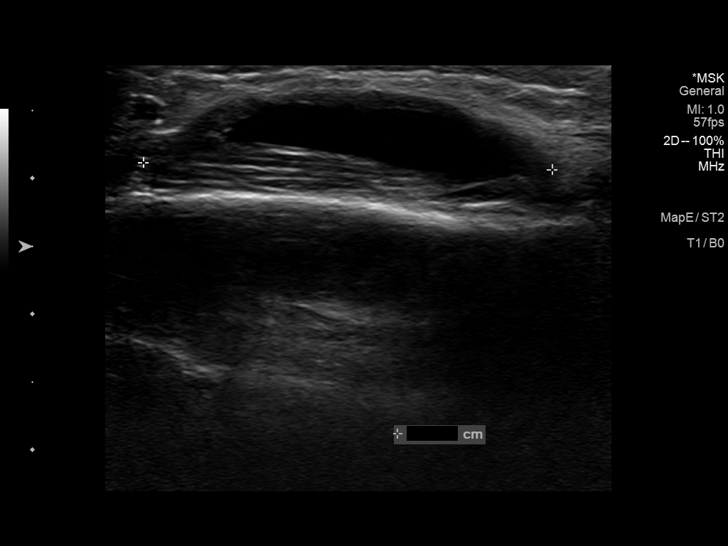
[im 3/14]
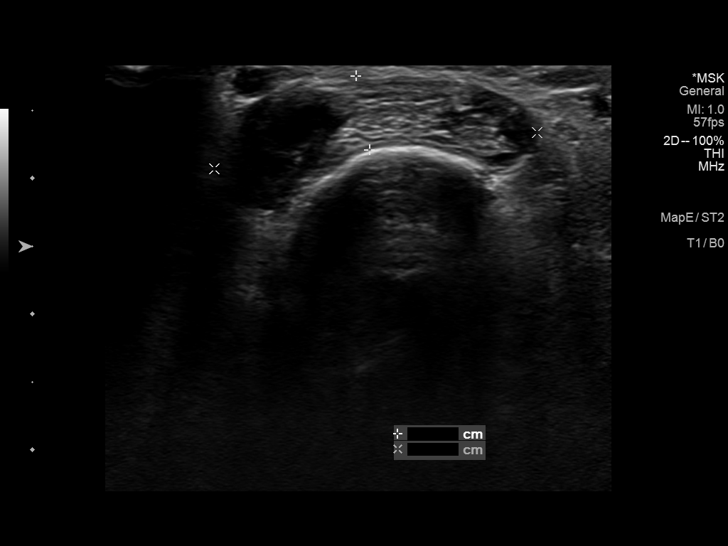
[im 4/14]
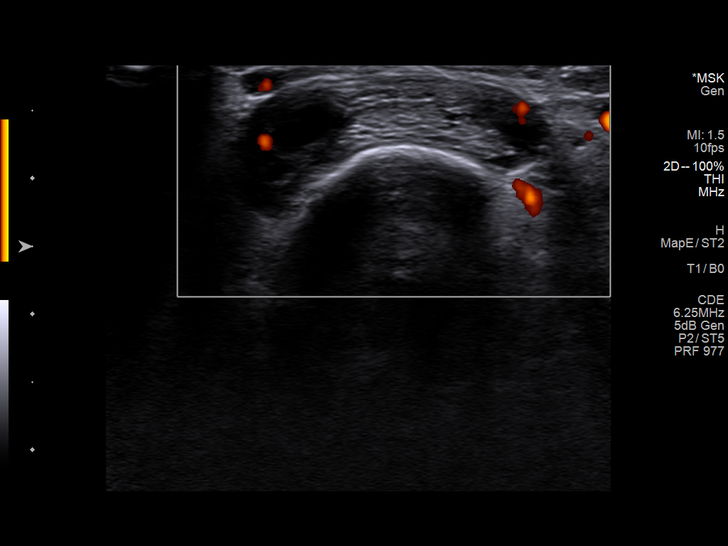
[im 5/14]
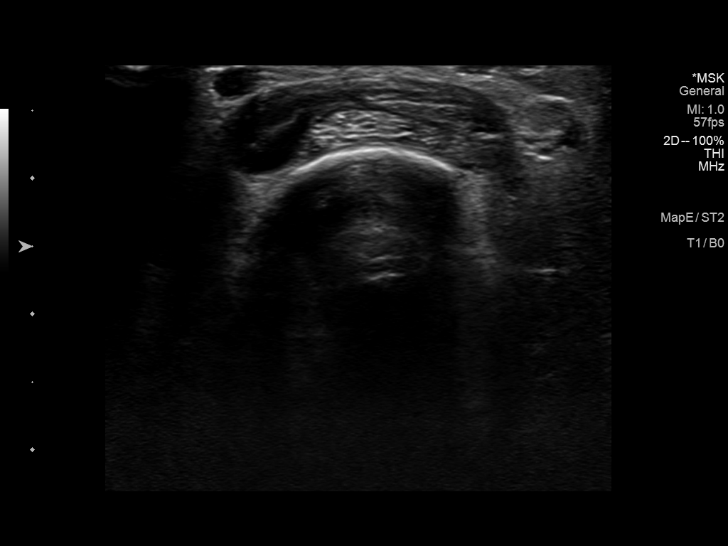
[im 6/14]
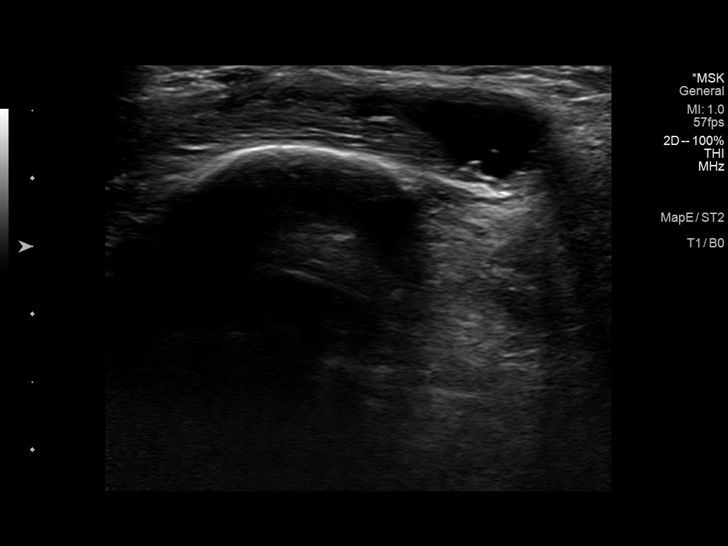
[im 7/14]
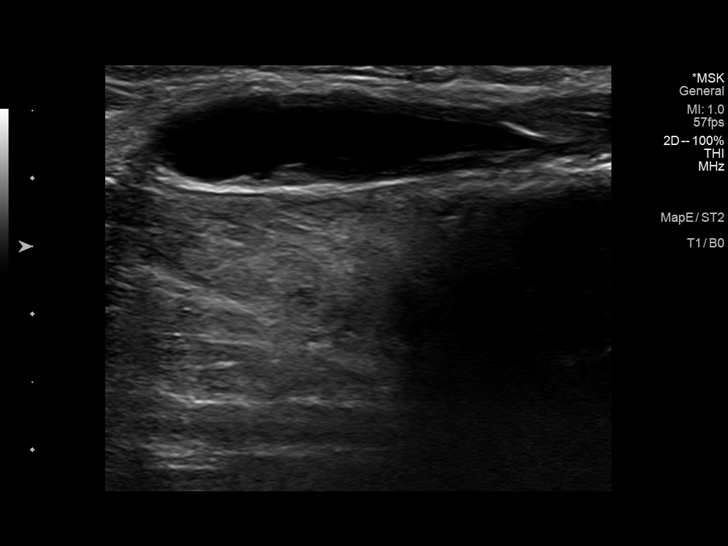
[im 8/14]
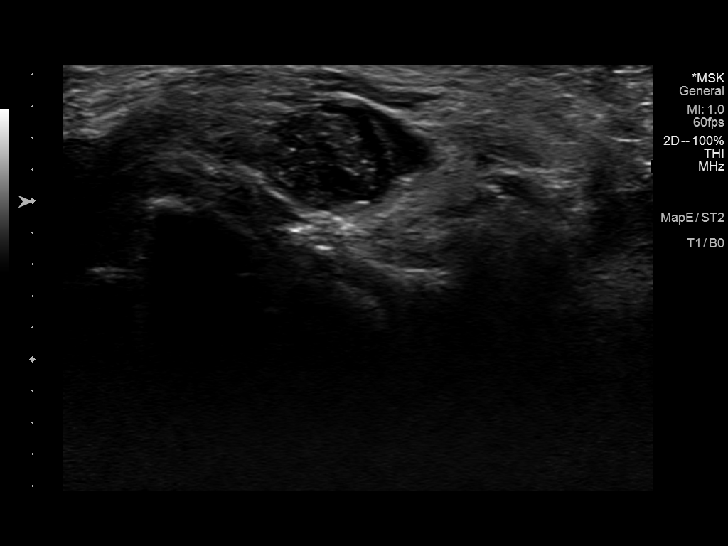
[im 9/14]
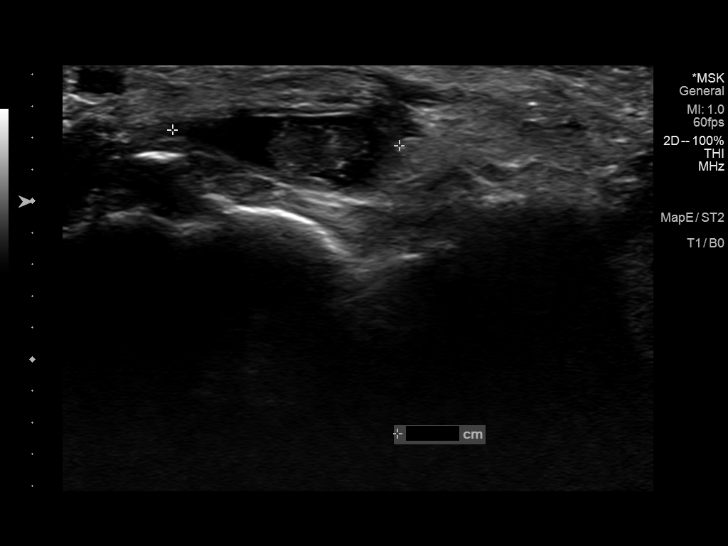
[im 10/14]
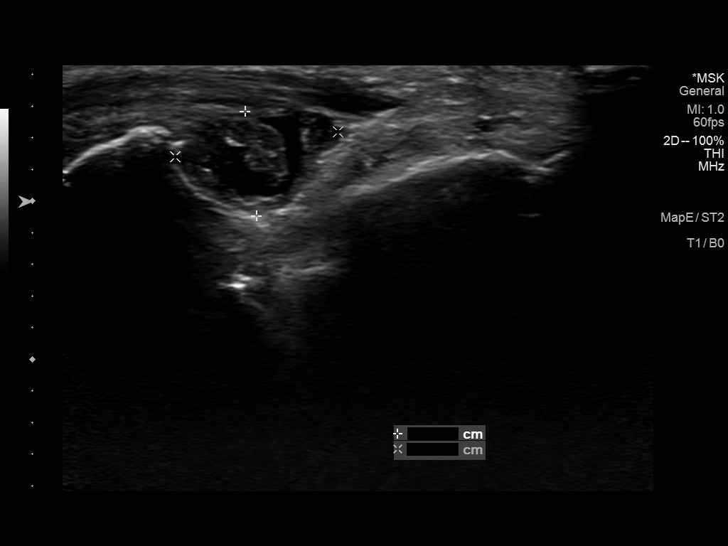
[im 11/14]
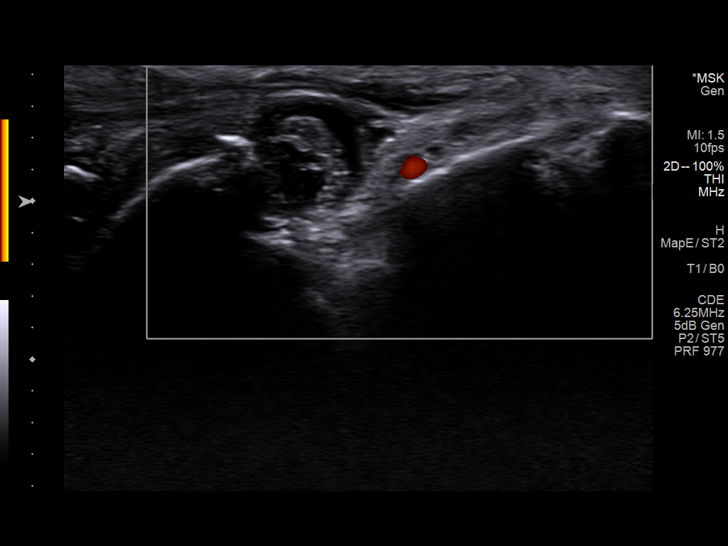
[im 12/14]
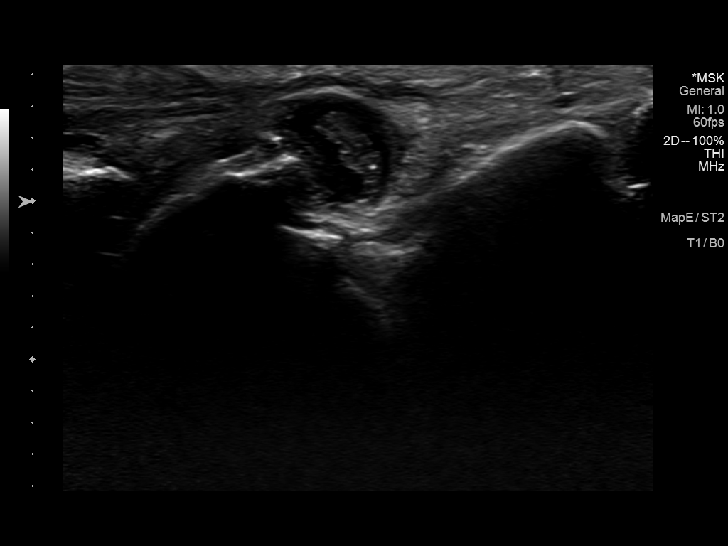
[im 13/14]
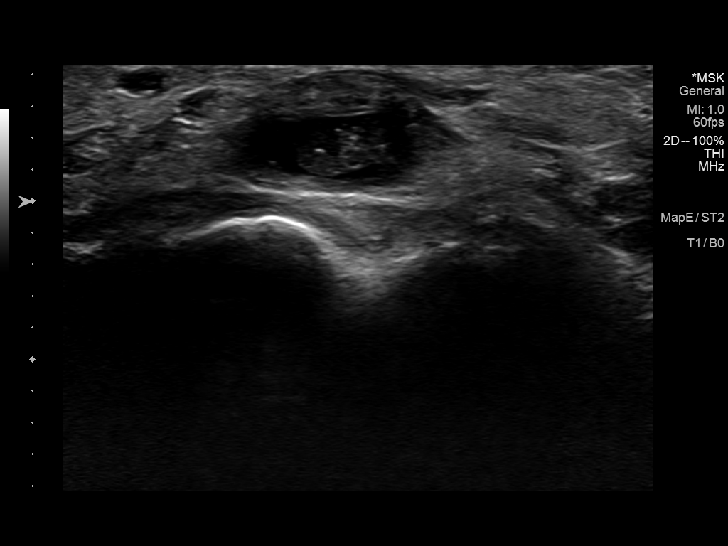
[im 14/14]
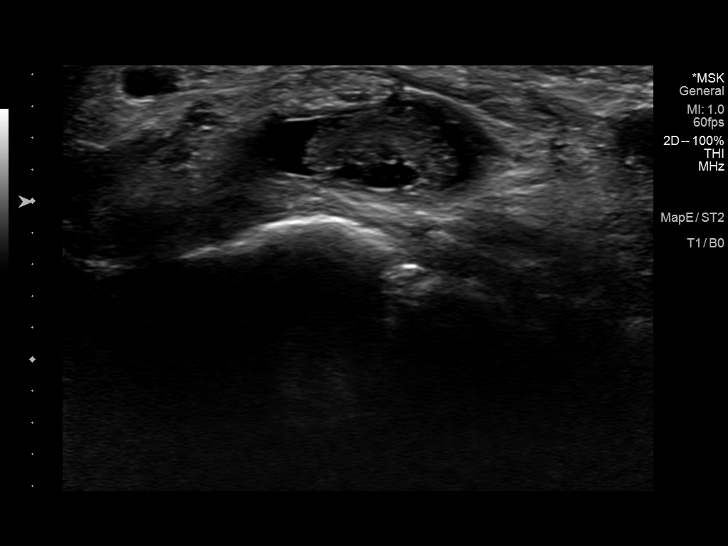

[14 of 14 positions shown; findings below may reference images not displayed]

FINDINGS: Real-time sonography of the left distal forearm and wrist was
performed with a high-frequency linear transducer.

3 x 0.6 x 2.4 cm complex cystic mass along the dorsal ulnar aspect
of the distal forearm with small areas of peripheral blood flow on
Doppler evaluation. Internal hypoechoic areas are noted.

1.4 x 0.7 x 1 cm complex mass in the dorsal ulnar aspect of the left
wrist with solid and cystic components. No internal Doppler flow.

No other soft tissue mass or hematoma.
IMPRESSION: 1. 3 x 0.6 x 2.4 cm complex cystic mass along the dorsal aspect of
the distal forearm and a 1.4 x 0.7 x 1 cm complex mass along the
left dorsal ulnar aspect of the wrist. Differential considerations
include complex cystic masses versus evolving hematoma. Recommend
further evaluation of with an MRI of the distal forearm/wrist
without and with IV contrast.

## 2022-11-11 ENCOUNTER — Ambulatory Visit (INDEPENDENT_AMBULATORY_CARE_PROVIDER_SITE_OTHER): Payer: Medicare HMO

## 2022-11-11 DIAGNOSIS — Z0000 Encounter for general adult medical examination without abnormal findings: Secondary | ICD-10-CM

## 2022-11-11 NOTE — Patient Instructions (Signed)
Philip Donaldson , Thank you for taking time to come for your Medicare Wellness Visit. I appreciate your ongoing commitment to your health goals. Please review the following plan we discussed and let me know if I can assist you in the future.   Referrals/Orders/Follow-Ups/Clinician Recommendations: none  This is a list of the screening recommended for you and due dates:  Health Maintenance  Topic Date Due   Flu Shot  11/07/2022   Medicare Annual Wellness Visit  11/11/2023   DTaP/Tdap/Td vaccine (4 - Td or Tdap) 04/29/2032   Zoster (Shingles) Vaccine  Completed   HPV Vaccine  Aged Out   Pneumonia Vaccine  Discontinued   COVID-19 Vaccine  Discontinued    Advanced directives: (Copy Requested) Please bring a copy of your health care power of attorney and living will to the office to be added to your chart at your convenience.  Next Medicare Annual Wellness Visit scheduled for next year: Yes  Preventive Care 56 Years and Older, Male  Preventive care refers to lifestyle choices and visits with your health care provider that can promote health and wellness. What does preventive care include? A yearly physical exam. This is also called an annual well check. Dental exams once or twice a year. Routine eye exams. Ask your health care provider how often you should have your eyes checked. Personal lifestyle choices, including: Daily care of your teeth and gums. Regular physical activity. Eating a healthy diet. Avoiding tobacco and drug use. Limiting alcohol use. Practicing safe sex. Taking low doses of aspirin every day. Taking vitamin and mineral supplements as recommended by your health care provider. What happens during an annual well check? The services and screenings done by your health care provider during your annual well check will depend on your age, overall health, lifestyle risk factors, and family history of disease. Counseling  Your health care provider may ask you questions about  your: Alcohol use. Tobacco use. Drug use. Emotional well-being. Home and relationship well-being. Sexual activity. Eating habits. History of falls. Memory and ability to understand (cognition). Work and work Astronomer. Screening  You may have the following tests or measurements: Height, weight, and BMI. Blood pressure. Lipid and cholesterol levels. These may be checked every 5 years, or more frequently if you are over 87 years old. Skin check. Lung cancer screening. You may have this screening every year starting at age 87 if you have a 30-pack-year history of smoking and currently smoke or have quit within the past 15 years. Fecal occult blood test (FOBT) of the stool. You may have this test every year starting at age 77. Flexible sigmoidoscopy or colonoscopy. You may have a sigmoidoscopy every 5 years or a colonoscopy every 10 years starting at age 44. Prostate cancer screening. Recommendations will vary depending on your family history and other risks. Hepatitis C blood test. Hepatitis B blood test. Sexually transmitted disease (STD) testing. Diabetes screening. This is done by checking your blood sugar (glucose) after you have not eaten for a while (fasting). You may have this done every 1-3 years. Abdominal aortic aneurysm (AAA) screening. You may need this if you are a current or former smoker. Osteoporosis. You may be screened starting at age 75 if you are at high risk. Talk with your health care provider about your test results, treatment options, and if necessary, the need for more tests. Vaccines  Your health care provider may recommend certain vaccines, such as: Influenza vaccine. This is recommended every year. Tetanus, diphtheria, and  acellular pertussis (Tdap, Td) vaccine. You may need a Td booster every 10 years. Zoster vaccine. You may need this after age 57. Pneumococcal 13-valent conjugate (PCV13) vaccine. One dose is recommended after age 71. Pneumococcal  polysaccharide (PPSV23) vaccine. One dose is recommended after age 37. Talk to your health care provider about which screenings and vaccines you need and how often you need them. This information is not intended to replace advice given to you by your health care provider. Make sure you discuss any questions you have with your health care provider. Document Released: 04/21/2015 Document Revised: 12/13/2015 Document Reviewed: 01/24/2015 Elsevier Interactive Patient Education  2017 [employer].  Fall Prevention in the Home Falls can cause injuries. They can happen to people of all ages. There are many things you can do to make your home safe and to help prevent falls. What can I do on the outside of my home? Regularly fix the edges of walkways and driveways and fix any cracks. Remove anything that might make you trip as you walk through a door, such as a raised step or threshold. Trim any bushes or trees on the path to your home. Use bright outdoor lighting. Clear any walking paths of anything that might make someone trip, such as rocks or tools. Regularly check to see if handrails are loose or broken. Make sure that both sides of any steps have handrails. Any raised decks and porches should have guardrails on the edges. Have any leaves, snow, or ice cleared regularly. Use sand or salt on walking paths during winter. Clean up any spills in your garage right away. This includes oil or grease spills. What can I do in the bathroom? Use night lights. Install grab bars by the toilet and in the tub and shower. Do not use towel bars as grab bars. Use non-skid mats or decals in the tub or shower. If you need to sit down in the shower, use a plastic, non-slip stool. Keep the floor dry. Clean up any water that spills on the floor as soon as it happens. Remove soap buildup in the tub or shower regularly. Attach bath mats securely with double-sided non-slip rug tape. Do not have throw rugs and other  things on the floor that can make you trip. What can I do in the bedroom? Use night lights. Make sure that you have a light by your bed that is easy to reach. Do not use any sheets or blankets that are too big for your bed. They should not hang down onto the floor. Have a firm chair that has side arms. You can use this for support while you get dressed. Do not have throw rugs and other things on the floor that can make you trip. What can I do in the kitchen? Clean up any spills right away. Avoid walking on wet floors. Keep items that you use a lot in easy-to-reach places. If you need to reach something above you, use a strong step stool that has a grab bar. Keep electrical cords out of the way. Do not use floor polish or wax that makes floors slippery. If you must use wax, use non-skid floor wax. Do not have throw rugs and other things on the floor that can make you trip. What can I do with my stairs? Do not leave any items on the stairs. Make sure that there are handrails on both sides of the stairs and use them. Fix handrails that are broken or loose. Make sure that  handrails are as long as the stairways. Check any carpeting to make sure that it is firmly attached to the stairs. Fix any carpet that is loose or worn. Avoid having throw rugs at the top or bottom of the stairs. If you do have throw rugs, attach them to the floor with carpet tape. Make sure that you have a light switch at the top of the stairs and the bottom of the stairs. If you do not have them, ask someone to add them for you. What else can I do to help prevent falls? Wear shoes that: Do not have high heels. Have rubber bottoms. Are comfortable and fit you well. Are closed at the toe. Do not wear sandals. If you use a stepladder: Make sure that it is fully opened. Do not climb a closed stepladder. Make sure that both sides of the stepladder are locked into place. Ask someone to hold it for you, if possible. Clearly  mark and make sure that you can see: Any grab bars or handrails. First and last steps. Where the edge of each step is. Use tools that help you move around (mobility aids) if they are needed. These include: Canes. Walkers. Scooters. Crutches. Turn on the lights when you go into a dark area. Replace any light bulbs as soon as they burn out. Set up your furniture so you have a clear path. Avoid moving your furniture around. If any of your floors are uneven, fix them. If there are any pets around you, be aware of where they are. Review your medicines with your doctor. Some medicines can make you feel dizzy. This can increase your chance of falling. Ask your doctor what other things that you can do to help prevent falls. This information is not intended to replace advice given to you by your health care provider. Make sure you discuss any questions you have with your health care provider. Document Released: 01/19/2009 Document Revised: 08/31/2015 Document Reviewed: 04/29/2014 Elsevier Interactive Patient Education  2017 [employer].

## 2022-11-11 NOTE — Progress Notes (Signed)
Subjective:   Philip Donaldson is a 89 y.o. male who presents for Medicare Annual/Subsequent preventive examination.  Visit Complete: Virtual  I connected with  [name] on 11/11/22 by a audio enabled telemedicine application and verified that I am speaking with the correct person using two identifiers.  Patient Location: Home  Provider Location: Office/Clinic  I discussed the limitations of evaluation and management by telemedicine. The patient expressed understanding and agreed to proceed.  Patient Medicare AWV questionnaire was completed by the patient on 11/04/2022; I have confirmed that all information answered by patient is correct and no changes since this date.  Vital Signs: Patient was unable to self-report vital signs via telehealth due to a lack of equipment at home.  Review of Systems     Cardiac Risk Factors include: advanced age (>13men, >17 women);dyslipidemia;hypertension;male gender     Objective:    Today's Vitals   There is no height or weight on file to calculate BMI.     11/11/2022    8:46 AM 11/07/2021    9:14 AM  Advanced Directives  Does Patient Have a Medical Advance Directive? Yes Yes  Type of Estate agent of [place];Living will Healthcare Power of [place];Living will  Copy of Healthcare Power of Attorney in Chart? No - copy requested No - copy requested    Current Medications (verified) Outpatient Encounter Medications as of 11/11/2022  Medication Sig   amLODipine (NORVASC) 10 MG tablet TAKE 1 TABLET EVERY DAY   apixaban (ELIQUIS) 5 MG TABS tablet Take 1 tablet (5 mg total) by mouth 2 (two) times daily.   diclofenac Sodium (VOLTAREN) 1 % GEL Apply a small grape sized dollop to sore joints on pends up to 4 times daily as needed.   hydrochlorothiazide (HYDRODIURIL) 25 MG tablet TAKE 1 TABLET EVERY DAY   lisinopril (ZESTRIL) 40 MG tablet TAKE 1 TABLET EVERY DAY   lovastatin (MEVACOR) 40 MG tablet TAKE 1 TABLET AT BEDTIME    metoprolol tartrate (LOPRESSOR) 100 MG tablet TAKE 1/2 TABLET TWICE DAILY   No facility-administered encounter medications on file as of 11/11/2022.    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   Cancer (HCC)    skin Ca-melanoma- scalp   Chronic kidney disease, stage 3 unspecified (HCC) 05/29/2022   Diverticulitis    Elevated LDL cholesterol level 04/06/2021   Essential hypertension 01/31/2020   Gout 02/02/2012   History of gout 01/31/2020   Hypercholesterolemia 05/29/2022   Hyperlipidemia    Hypertension    Mass of arm, left 01/31/2020   New onset atrial fibrillation (HCC) 06/04/2022   Pain of right hand 04/29/2022   Tubular adenoma of colon 05/10/2011   Past Surgical History:  Procedure Laterality Date   COLONOSCOPY     POLYPECTOMY     SKIN CANCER EXCISION     to scalp   Family History  Problem Relation Age of Onset   Colon cancer Neg Hx    Esophageal cancer Neg Hx    Rectal cancer Neg Hx    Stomach cancer Neg Hx    Social History   Socioeconomic History   Marital status: Widowed    Spouse name: Not on file   Number of children: 3   Years of education: Not on file   Highest education level: Not on file  Occupational History   Occupation: Retired   Tobacco Use  Smoking status: Former   Smokeless tobacco: Never  Vaping Use   Vaping status: Never Used  Substance and Sexual Activity   Alcohol use: No   Drug use: No   Sexual activity: Not on file  Other Topics Concern   Not on file  Social History Narrative   Daily caffeine    Social Determinants of Health   Financial Resource Strain: Low Risk  (11/11/2022)   Overall Financial Resource Strain (CARDIA)    Difficulty of Paying Living Expenses: Not hard at all  Food Insecurity: No Food Insecurity (11/11/2022)   Hunger Vital Sign    Worried About Running Out of Food in the Last  Year: Never true    Ran Out of Food in the Last Year: Never true  Transportation Needs: No Transportation Needs (11/11/2022)   PRAPARE - Administrator, Civil Service (Medical): No    Lack of Transportation (Non-Medical): No  Physical Activity: Sufficiently Active (11/11/2022)   Exercise Vital Sign    Days of Exercise per Week: 7 days    Minutes of Exercise per Session: 30 min  Stress: No Stress Concern Present (11/11/2022)   [employer] of Occupational Health - Occupational Stress Questionnaire    Feeling of Stress : Not at all  Social Connections: Moderately Isolated (11/11/2022)   Social Connection and Isolation Panel [NHANES]    Frequency of Communication with Friends and Family: More than three times a week    Frequency of Social Gatherings with Friends and Family: Twice a week    Attends Religious Services: 1 to 4 times per year    Active Member of [employer] or Organizations: No    Attends Banker Meetings: Never    Marital Status: Widowed    Tobacco Counseling Counseling given: Not Answered   Clinical Intake:  Pre-visit preparation completed: Yes  Pain : No/denies pain     Nutritional Risks: None Diabetes: No  How often do you need to have someone help you when you read instructions, pamphlets, or other written materials from your doctor or pharmacy?: 1 - Never  Interpreter Needed?: No  Information entered by :: NAllen LPN   Activities of Daily Living    11/04/2022    8:05 PM  In your present state of health, do you have any difficulty performing the following activities:  Hearing? 0  Vision? 0  Difficulty concentrating or making decisions? 0  Walking or climbing stairs? 0  Dressing or bathing? 0  Doing errands, shopping? 0  Preparing Food and eating ? N  Using the Toilet? N  In the past six months, have you accidently leaked urine? N  Do you have problems with loss of bowel control? N  Managing your Medications? N  Managing your  Finances? N  Housekeeping or managing your Housekeeping? N    Patient Care Team: Mliss Sax, MD as PCP - General (Family Medicine)  Indicate any recent Medical Services you may have received from other than [facility] providers in the past year (date may be approximate).     Assessment:   This is a routine wellness examination for Philip Donaldson.  Hearing/Vision screen Hearing Screening - Comments:: Denies hearing issues Vision Screening - Comments:: Regular eye exams, [facility]  Dietary issues and exercise activities discussed:     Goals Addressed             This Visit's Progress    Patient Stated       11/11/2022, denies any  goals       Depression Screen    11/11/2022    8:47 AM 06/10/2022   10:10 AM 04/29/2022   10:00 AM 11/07/2021    9:14 AM 11/07/2021    9:13 AM 10/25/2021   10:32 AM 12/26/2020   10:10 AM  PHQ 2/9 Scores  PHQ - 2 Score 0 0 0 0 0 0 0  PHQ- 9 Score 0          Fall Risk    11/04/2022    8:05 PM 06/10/2022   10:10 AM 04/29/2022   10:00 AM 11/07/2021    9:15 AM 10/25/2021   10:32 AM  Fall Risk   Falls in the past year? 0 0 0 0 0  Number falls in past yr: 0 0 0 0 0  Injury with Fall? 0 0 0 0   Risk for fall due to : Medication side effect No Fall Risks No Fall Risks    Follow up Falls prevention discussed;Falls evaluation completed Falls evaluation completed Falls evaluation completed Falls evaluation completed;Education provided     MEDICARE RISK AT HOME:  Medicare Risk at Home - 11/11/22 0847     Any stairs in or around the home? Yes    If so, are there any without handrails? No    Home free of loose throw rugs in walkways, pet beds, electrical cords, etc? Yes    Adequate lighting in your home to reduce risk of falls? Yes    Life alert? Yes    Use of a cane, walker or w/c? No    Grab bars in the bathroom? Yes    Shower chair or bench in shower? Yes    Elevated toilet seat or a handicapped toilet? No             TIMED UP AND GO:  Was  the test performed?  No    Cognitive Function:        11/11/2022    8:48 AM 11/07/2021    9:16 AM  6CIT Screen  What Year? 0 points 0 points  What month? 0 points 0 points  What time? 0 points 0 points  Count back from 20 0 points 0 points  Months in reverse 2 points 0 points  Repeat phrase 2 points 0 points  Total Score 4 points 0 points    Immunizations Immunization History  Administered Date(s) Administered   Fluad Quad(high Dose 65+) 12/26/2020   Influenza Split 12/20/2008, 01/05/2013, 12/28/2014, 01/09/2016, 01/11/2017, 01/07/2019   Influenza, High Dose Seasonal PF 01/13/2012, 12/20/2021   Influenza,inj,Quad PF,6+ Mos 01/13/2012, 01/05/2013   Influenza,trivalent, recombinat, inj, PF 01/05/2011   Influenza-Unspecified 12/07/2013, 01/01/2014, 01/05/2020   Moderna Sars-Covid-2 Vaccination 06/04/2019, 07/02/2019, 02/07/2020   PFIZER(Purple Top)SARS-COV-2 Vaccination 06/04/2019   Pneumococcal Polysaccharide-23 11/19/1997   Rsv, Bivalent, Protein Subunit Rsvpref,pf Verdis Frederickson) 12/20/2021   Td 06/20/2006   Td (Adult) 06/20/2006   Tdap 04/29/2022   Zoster Recombinant(Shingrix) 05/18/2021, 07/18/2021    TDAP status: Up to date  Flu Vaccine status: Due, Education has been provided regarding the importance of this vaccine. Advised may receive this vaccine at local pharmacy or Health Dept. Aware to provide a copy of the vaccination record if obtained from local pharmacy or Health Dept. Verbalized acceptance and understanding.  Pneumococcal vaccine status: Up to date  Covid-19 vaccine status: Completed vaccines  Qualifies for Shingles Vaccine? Yes   Zostavax completed Yes   Shingrix Completed?: Yes  Screening Tests Health Maintenance  Topic Date Due  INFLUENZA VACCINE  11/07/2022   Medicare Annual Wellness (AWV)  11/11/2023   DTaP/Tdap/Td (4 - Td or Tdap) 04/29/2032   Zoster Vaccines- Shingrix  Completed   HPV VACCINES  Aged Out   Pneumonia Vaccine 67+ Years old   Discontinued   COVID-19 Vaccine  Discontinued    Health Maintenance  Health Maintenance Due  Topic Date Due   INFLUENZA VACCINE  11/07/2022    Colorectal cancer screening: No longer required.   Lung Cancer Screening: (Low Dose CT Chest recommended if Age 29-80 years, 20 pack-year currently smoking OR have quit w/in 15years.) does not qualify.   Lung Cancer Screening Referral: no  Additional Screening:  Hepatitis C Screening: does not qualify;   Vision Screening: Recommended annual ophthalmology exams for early detection of glaucoma and other disorders of the eye. Is the patient up to date with their annual eye exam?  Yes  Who is the provider or what is the name of the office in which the patient attends annual eye exams? [facility] If pt is not established with a provider, would they like to be referred to a provider to establish care? No .   Dental Screening: Recommended annual dental exams for proper oral hygiene  Diabetic Foot Exam: n/a  Community Resource Referral / Chronic Care Management: CRR required this visit?  No   CCM required this visit?  No     Plan:     I have personally reviewed and noted the following in the patient's chart:   Medical and social history Use of alcohol, tobacco or illicit drugs  Current medications and supplements including opioid prescriptions. Patient is not currently taking opioid prescriptions. Functional ability and status Nutritional status Physical activity Advanced directives List of other physicians Hospitalizations, surgeries, and ER visits in previous 12 months Vitals Screenings to include cognitive, depression, and falls Referrals and appointments  In addition, I have reviewed and discussed with patient certain preventive protocols, quality metrics, and best practice recommendations. A written personalized care plan for preventive services as well as general preventive health recommendations were provided to  patient.     Barb Merino, LPN   04/13/1094   After Visit Summary: (MyChart) Due to this being a telephonic visit, the after visit summary with patients personalized plan was offered to patient via MyChart   Nurse Notes: none

## 2022-11-20 ENCOUNTER — Other Ambulatory Visit: Payer: Self-pay | Admitting: Family Medicine

## 2022-11-20 DIAGNOSIS — I10 Essential (primary) hypertension: Secondary | ICD-10-CM

## 2022-11-28 DIAGNOSIS — H401112 Primary open-angle glaucoma, right eye, moderate stage: Secondary | ICD-10-CM | POA: Diagnosis not present

## 2022-11-28 DIAGNOSIS — H401121 Primary open-angle glaucoma, left eye, mild stage: Secondary | ICD-10-CM | POA: Diagnosis not present

## 2022-12-27 DIAGNOSIS — H353111 Nonexudative age-related macular degeneration, right eye, early dry stage: Secondary | ICD-10-CM | POA: Diagnosis not present

## 2022-12-27 DIAGNOSIS — H401121 Primary open-angle glaucoma, left eye, mild stage: Secondary | ICD-10-CM | POA: Diagnosis not present

## 2022-12-27 DIAGNOSIS — H401112 Primary open-angle glaucoma, right eye, moderate stage: Secondary | ICD-10-CM | POA: Diagnosis not present

## 2022-12-27 DIAGNOSIS — H4912 Fourth [trochlear] nerve palsy, left eye: Secondary | ICD-10-CM | POA: Diagnosis not present

## 2023-01-14 DIAGNOSIS — Z1283 Encounter for screening for malignant neoplasm of skin: Secondary | ICD-10-CM | POA: Diagnosis not present

## 2023-01-14 DIAGNOSIS — X32XXXD Exposure to sunlight, subsequent encounter: Secondary | ICD-10-CM | POA: Diagnosis not present

## 2023-01-14 DIAGNOSIS — Z85820 Personal history of malignant melanoma of skin: Secondary | ICD-10-CM | POA: Diagnosis not present

## 2023-01-14 DIAGNOSIS — L820 Inflamed seborrheic keratosis: Secondary | ICD-10-CM | POA: Diagnosis not present

## 2023-01-14 DIAGNOSIS — Z08 Encounter for follow-up examination after completed treatment for malignant neoplasm: Secondary | ICD-10-CM | POA: Diagnosis not present

## 2023-01-14 DIAGNOSIS — D225 Melanocytic nevi of trunk: Secondary | ICD-10-CM | POA: Diagnosis not present

## 2023-01-14 DIAGNOSIS — L570 Actinic keratosis: Secondary | ICD-10-CM | POA: Diagnosis not present

## 2023-01-14 DIAGNOSIS — L821 Other seborrheic keratosis: Secondary | ICD-10-CM | POA: Diagnosis not present

## 2023-01-16 ENCOUNTER — Telehealth: Payer: Self-pay

## 2023-01-16 NOTE — Patient Instructions (Signed)
Visit Information  Thank you for taking time to visit with me today. Please don't hesitate to contact me if I can be of assistance to you.   Following are the goals we discussed today:   Goals Addressed             This Visit's Progress    COMPLETED: Care Coordination Activities-No follow up required       Care Coordination Interventions: Advised patient to Annual Wellness exam. Discussed [facility] services and support. Assessed SDOH. Advised to discuss with primary care physician if services needed in the future.          If you are experiencing a Mental Health or Behavioral Health Crisis or need someone to talk to, please call the Suicide and Crisis Lifeline: 988   Patient verbalizes understanding of instructions and care plan provided today and agrees to view in MyChart. Active MyChart status and patient understanding of how to access instructions and care plan via MyChart confirmed with patient.     The patient has been provided with contact information for the care management team and has been advised to call with any health related questions or concerns.   Bary Leriche, RN, MSN [facility], [facility] Management Community Coordinator Direct Dial: [phone]  Fax: [phone] Website: [email or web address]

## 2023-01-16 NOTE — Patient Outreach (Signed)
Care Coordination   Initial Visit Note   01/16/2023 Name: Philip Donaldson MRN: [id number] DOB: 11/06/1928  Philip Donaldson is a 89 y.o. year old male who sees Mliss Sax, MD for primary care. I spoke with  Philip Donaldson by phone today.  What matters to the patients health and wellness today?  none    Goals Addressed             This Visit's Progress    COMPLETED: Care Coordination Activities-No follow up required       Care Coordination Interventions: Advised patient to Annual Wellness exam. Discussed [facility] services and support. Assessed SDOH. Advised to discuss with primary care physician if services needed in the future.          SDOH assessments and interventions completed:  Yes  SDOH Interventions Today    Flowsheet Row Most Recent Value  SDOH Interventions   Housing Interventions Intervention Not Indicated  Transportation Interventions Intervention Not Indicated        Care Coordination Interventions:  Yes, provided   Follow up plan: No further intervention required.   Encounter Outcome:  Patient Visit Completed   Bary Leriche, RN, MSN [facility]  [facility], [facility] Management Community Coordinator Direct Dial: [phone]  Fax: [phone] Website: [email or web address]

## 2023-02-05 ENCOUNTER — Other Ambulatory Visit: Payer: Self-pay | Admitting: Family Medicine

## 2023-02-05 DIAGNOSIS — I10 Essential (primary) hypertension: Secondary | ICD-10-CM

## 2023-02-06 DIAGNOSIS — I6782 Cerebral ischemia: Secondary | ICD-10-CM | POA: Diagnosis not present

## 2023-02-06 DIAGNOSIS — H4912 Fourth [trochlear] nerve palsy, left eye: Secondary | ICD-10-CM | POA: Diagnosis not present

## 2023-02-06 DIAGNOSIS — G527 Disorders of multiple cranial nerves: Secondary | ICD-10-CM | POA: Diagnosis not present

## 2023-02-13 ENCOUNTER — Ambulatory Visit: Payer: Medicare HMO | Attending: Cardiology | Admitting: Cardiology

## 2023-02-13 ENCOUNTER — Encounter: Payer: Self-pay | Admitting: Cardiology

## 2023-02-13 VITALS — BP 174/74 | HR 62 | Ht 66.0 in | Wt 183.0 lb

## 2023-02-13 DIAGNOSIS — I10 Essential (primary) hypertension: Secondary | ICD-10-CM | POA: Diagnosis not present

## 2023-02-13 DIAGNOSIS — I4891 Unspecified atrial fibrillation: Secondary | ICD-10-CM | POA: Diagnosis not present

## 2023-02-13 DIAGNOSIS — E785 Hyperlipidemia, unspecified: Secondary | ICD-10-CM

## 2023-02-13 NOTE — Patient Instructions (Signed)
Please keep a BP log for 2 weeks and send by MyChart or mail.  Blood Pressure Record Sheet To take your blood pressure, you will need a blood pressure machine. You can buy a blood pressure machine (blood pressure monitor) at your clinic, drug store, or online. When choosing one, consider: An automatic monitor that has an arm cuff. A cuff that wraps snugly around your upper arm. You should be able to fit only one finger between your arm and the cuff. A device that stores blood pressure reading results. Do not choose a monitor that measures your blood pressure from your wrist or finger. Follow your health care provider's instructions for how to take your blood pressure. To use this form: Get one reading in the morning (a.m.) 1-2 hours after you take any medicines. Get one reading in the evening (p.m.) before supper.   Blood pressure log Date: _______________________  a.m. _____________________(1st reading) HR___________            p.m. _____________________(2nd reading) HR__________  Date: _______________________  a.m. _____________________(1st reading) HR___________            p.m. _____________________(2nd reading) HR__________  Date: _______________________  a.m. _____________________(1st reading) HR___________            p.m. _____________________(2nd reading) HR__________  Date: _______________________  a.m. _____________________(1st reading) HR___________            p.m. _____________________(2nd reading) HR__________  Date: _______________________  a.m. _____________________(1st reading) HR___________            p.m. _____________________(2nd reading) HR__________  Date: _______________________  a.m. _____________________(1st reading) HR___________            p.m. _____________________(2nd reading) HR__________  Date: _______________________  a.m. _____________________(1st reading) HR___________            p.m. _____________________(2nd reading)  HR__________   This information is not intended to replace advice given to you by your health care provider. Make sure you discuss any questions you have with your health care provider. Document Revised: 07/14/2019 Document Reviewed: 07/14/2019 Elsevier Patient Education  2021 Elsevier Inc.   Medication Instructions:  Your physician recommends that you continue on your current medications as directed. Please refer to the Current Medication list given to you today.  *If you need a refill on your cardiac medications before your next appointment, please call your pharmacy*   Lab Work: Your physician recommends that you have a CMP, CBC, TSH and lipids today in the office.  If you have labs (blood work) drawn today and your tests are completely normal, you will receive your results only by: MyChart Message (if you have MyChart) OR A paper copy in the mail If you have any lab test that is abnormal or we need to change your treatment, we will call you to review the results.   Testing/Procedures: None ordered   Follow-Up: At [facility], you and your health needs are our priority.  As part of our continuing mission to provide you with exceptional heart care, we have created designated Provider Care Teams.  These Care Teams include your primary Cardiologist (physician) and Advanced Practice Providers (APPs -  Physician Assistants and Nurse Practitioners) who all work together to provide you with the care you need, when you need it.  We recommend signing up for the patient portal called "MyChart".  Sign up information is provided on this After Visit Summary.  MyChart is used to connect with patients for Virtual Visits (Telemedicine).  Patients are able to view  lab/test results, encounter notes, upcoming appointments, etc.  Non-urgent messages can be sent to your provider as well.   To learn more about what you can do with MyChart, go to [email or web address].    Your next  appointment:   6 month(s)  The format for your next appointment:   In Person  Provider:   Belva Crome, MD    Other Instructions none  Important Information About Sugar

## 2023-02-13 NOTE — Progress Notes (Signed)
Eliquis dose Age 89 Wt 83 kg Creatine 1.09  Dose 5 mg BID

## 2023-02-13 NOTE — Progress Notes (Signed)
Cardiology Office Note:    Date:  02/13/2023   ID:  Philip Donaldson, DOB [birth year], MRN [id number]  PCP:  Mliss Sax, MD  Cardiologist:  Garwin Brothers, MD   Referring MD: Mliss Sax,*    ASSESSMENT:    1. Essential hypertension   2. New onset atrial fibrillation (HCC)   3. Hyperlipidemia, unspecified hyperlipidemia type    PLAN:    In order of problems listed above:  Primary prevention stressed with the patient.  Importance of compliance with diet medication stressed and patient verbalized standing. Patient is well ambulatory appropriate for his age. Paroxysmal atrial fibrillation:I discussed with the patient atrial fibrillation, disease process. Management and therapy including rate and rhythm control, anticoagulation benefits and potential risks were discussed extensively with the patient. Patient had multiple questions which were answered to patient's satisfaction.  I confirmed he is stable on his gait. Essential hypertension: Blood pressure is elevated.  I rechecked this and blood pressure was 148/70.  He he is well equipped to do blood pressure checks at home.  He has done that in the past.  He will bring [place] a blood pressure log in about a week's time and we will titrate his medications if necessary. Mixed dyslipidemia: On lipid-lowering medications followed by primary care. Patient will be seen in follow-up appointment in 9 months or earlier if the patient has any concerns.    Medication Adjustments/Labs and Tests Ordered: Current medicines are reviewed at length with the patient today.  Concerns regarding medicines are outlined above.  Orders Placed This Encounter  Procedures   Comprehensive metabolic panel   CBC   Lipid panel   TSH   No orders of the defined types were placed in this encounter.    No chief complaint on file.    History of Present Illness:    Philip Donaldson is a 89 y.o. male.  Patient has past medical history of  paroxysmal fibrillation, essential hypertension, mixed dyslipidemia.  He denies any problems at this time and takes care of activities of daily living.  He ambulates age appropriately.  At the time of my evaluation, the patient is alert awake oriented and in no distress.  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   Cancer (HCC)    skin Ca-melanoma- scalp   Chronic kidney disease, stage 3 unspecified (HCC) 05/29/2022   Diverticulitis    Elevated LDL cholesterol level 04/06/2021   Essential hypertension 01/31/2020   Gout 02/02/2012   History of gout 01/31/2020   Hypercholesterolemia 05/29/2022   Hyperlipidemia    Hypertension    Mass of arm, left 01/31/2020   New onset atrial fibrillation (HCC) 06/04/2022   Pain of right hand 04/29/2022   Tubular adenoma of colon 05/10/2011    Past Surgical History:  Procedure Laterality Date   COLONOSCOPY     POLYPECTOMY     SKIN CANCER EXCISION     to scalp    Current Medications: Current Meds  Medication Sig   amLODipine (NORVASC) 10 MG tablet TAKE 1 TABLET EVERY DAY   apixaban (ELIQUIS) 5 MG TABS tablet Take 1 tablet (5 mg total) by mouth 2 (two) times daily.   diclofenac Sodium (VOLTAREN) 1 % GEL Apply a small grape sized dollop to sore joints on pends up to 4 times daily as needed.   hydrochlorothiazide (HYDRODIURIL) 25 MG tablet TAKE 1 TABLET EVERY DAY   lisinopril (ZESTRIL) 40 MG tablet TAKE 1 TABLET EVERY DAY  lovastatin (MEVACOR) 40 MG tablet TAKE 1 TABLET AT BEDTIME   metoprolol tartrate (LOPRESSOR) 100 MG tablet TAKE 1/2 TABLET TWICE DAILY     Allergies:   Clarithromycin, Indomethacin, Pneumococcal vaccines, and Tuberculin tests   Social History   Socioeconomic History   Marital status: Widowed    Spouse name: Not on file   Number of children: 3   Years of education: Not on file   Highest education level: Not on file  Occupational History   Occupation: Retired   Tobacco Use   Smoking status:  Former   Smokeless tobacco: Never  Advertising account planner   Vaping status: Never Used  Substance and Sexual Activity   Alcohol use: No   Drug use: No   Sexual activity: Not on file  Other Topics Concern   Not on file  Social History Narrative   Daily caffeine    Social Determinants of Health   Financial Resource Strain: Low Risk  (11/11/2022)   Overall Financial Resource Strain (CARDIA)    Difficulty of Paying Living Expenses: Not hard at all  Food Insecurity: No Food Insecurity (11/11/2022)   Hunger Vital Sign    Worried About Running Out of Food in the Last Year: Never true    Ran Out of Food in the Last Year: Never true  Transportation Needs: No Transportation Needs (01/16/2023)   PRAPARE - Administrator, Civil Service (Medical): No    Lack of Transportation (Non-Medical): No  Physical Activity: Sufficiently Active (11/11/2022)   Exercise Vital Sign    Days of Exercise per Week: 7 days    Minutes of Exercise per Session: 30 min  Stress: No Stress Concern Present (11/11/2022)   [employer] of Occupational Health - Occupational Stress Questionnaire    Feeling of Stress : Not at all  Social Connections: Moderately Isolated (11/11/2022)   Social Connection and Isolation Panel [NHANES]    Frequency of Communication with Friends and Family: More than three times a week    Frequency of Social Gatherings with Friends and Family: Twice a week    Attends Religious Services: 1 to 4 times per year    Active Member of [employer] or Organizations: No    Attends Banker Meetings: Never    Marital Status: Widowed     Family History: The patient's family history is negative for Colon cancer, Esophageal cancer, Rectal cancer, and Stomach cancer.  ROS:   Please see the history of present illness.    All other systems reviewed and are negative.  EKGs/Labs/Other Studies Reviewed:    The following studies were reviewed today: I discussed my findings with the patient at  length   Recent Labs: 06/04/2022: BUN 17; Creatinine, Ser 1.09; Hemoglobin 16.4; Platelets 170; Potassium 4.3; Sodium 140; TSH 3.940  Recent Lipid Panel    Component Value Date/Time   CHOL 126 10/25/2021 1129   TRIG 99.0 10/25/2021 1129   HDL 47.90 10/25/2021 1129   CHOLHDL 3 10/25/2021 1129   VLDL 19.8 10/25/2021 1129   LDLCALC 58 10/25/2021 1129   LDLDIRECT 79.0 04/06/2021 1103    Physical Exam:    VS:  BP (!) 174/74   Pulse 62   Ht 5\' 6"  (1.676 m)   Wt 183 lb (83 kg)   SpO2 96%   BMI 29.54 kg/m     Wt Readings from Last 3 Encounters:  02/13/23 183 lb (83 kg)  07/11/22 176 lb 0.6 oz (79.9 kg)  06/10/22 178 lb (80.7  kg)     GEN: Patient is in no acute distress HEENT: Normal NECK: No JVD; No carotid bruits LYMPHATICS: No lymphadenopathy CARDIAC: Hear sounds regular, 2/6 systolic murmur at the apex. RESPIRATORY:  Clear to auscultation without rales, wheezing or rhonchi  ABDOMEN: Soft, non-tender, non-distended MUSCULOSKELETAL:  No edema; No deformity  SKIN: Warm and dry NEUROLOGIC:  Alert and oriented x 3 PSYCHIATRIC:  Normal affect   Signed, Garwin Brothers, MD  02/13/2023 10:53 AM    [facility]

## 2023-02-14 LAB — CBC
Hematocrit: 50.5 % (ref 37.5–51.0)
Hemoglobin: 16.5 g/dL (ref 13.0–17.7)
MCH: 31.5 pg (ref 26.6–33.0)
MCHC: 32.7 g/dL (ref 31.5–35.7)
MCV: 96 fL (ref 79–97)
Platelets: 207 10*3/uL (ref 150–450)
RBC: 5.24 x10E6/uL (ref 4.14–5.80)
RDW: 12.5 % (ref 11.6–15.4)
WBC: 9.8 10*3/uL (ref 3.4–10.8)

## 2023-02-14 LAB — COMPREHENSIVE METABOLIC PANEL
ALT: 22 [IU]/L (ref 0–44)
AST: 34 [IU]/L (ref 0–40)
Albumin: 4.3 g/dL (ref 3.6–4.6)
Alkaline Phosphatase: 105 [IU]/L (ref 44–121)
BUN/Creatinine Ratio: 14 (ref 10–24)
BUN: 16 mg/dL (ref 10–36)
Bilirubin Total: 0.8 mg/dL (ref 0.0–1.2)
CO2: 26 mmol/L (ref 20–29)
Calcium: 10.7 mg/dL — ABNORMAL HIGH (ref 8.6–10.2)
Chloride: 100 mmol/L (ref 96–106)
Creatinine, Ser: 1.17 mg/dL (ref 0.76–1.27)
Globulin, Total: 3 g/dL (ref 1.5–4.5)
Glucose: 101 mg/dL — ABNORMAL HIGH (ref 70–99)
Potassium: 4.1 mmol/L (ref 3.5–5.2)
Sodium: 140 mmol/L (ref 134–144)
Total Protein: 7.3 g/dL (ref 6.0–8.5)
eGFR: 58 mL/min/{1.73_m2} — ABNORMAL LOW (ref >59)

## 2023-02-14 LAB — LIPID PANEL
Chol/HDL Ratio: 3.2 ratio (ref 0.0–5.0)
Cholesterol, Total: 160 mg/dL (ref 100–199)
HDL: 50 mg/dL (ref >39)
LDL Chol Calc (NIH): 89 mg/dL (ref 0–99)
Triglycerides: 119 mg/dL (ref 0–149)
VLDL Cholesterol Cal: 21 mg/dL (ref 5–40)

## 2023-02-14 LAB — TSH: TSH: 4.24 u[IU]/mL (ref 0.450–4.500)

## 2023-02-19 ENCOUNTER — Other Ambulatory Visit: Payer: Self-pay | Admitting: Family Medicine

## 2023-02-19 DIAGNOSIS — I10 Essential (primary) hypertension: Secondary | ICD-10-CM

## 2023-02-24 ENCOUNTER — Encounter: Payer: Self-pay | Admitting: Cardiology

## 2023-02-27 DIAGNOSIS — H401121 Primary open-angle glaucoma, left eye, mild stage: Secondary | ICD-10-CM | POA: Diagnosis not present

## 2023-02-27 DIAGNOSIS — H4912 Fourth [trochlear] nerve palsy, left eye: Secondary | ICD-10-CM | POA: Diagnosis not present

## 2023-02-27 DIAGNOSIS — H401112 Primary open-angle glaucoma, right eye, moderate stage: Secondary | ICD-10-CM | POA: Diagnosis not present

## 2023-03-31 ENCOUNTER — Other Ambulatory Visit: Payer: Self-pay | Admitting: Family Medicine

## 2023-03-31 DIAGNOSIS — I10 Essential (primary) hypertension: Secondary | ICD-10-CM

## 2023-04-14 ENCOUNTER — Telehealth: Payer: Self-pay

## 2023-04-14 NOTE — Telephone Encounter (Signed)
 Copied from CRM [id number]. Topic: Clinical - Prescription Issue >> Apr 14, 2023  1:27 PM Joanell B wrote: Reason for CRM: Pt stated that he is needing authorization for medication metoprolol  tartrate (LOPRESSOR ) 100 MG and was informed by the pharmacy once it's approved then he will be able to pick it up.  RX refill request was sent to prior authorization team.

## 2023-04-15 ENCOUNTER — Other Ambulatory Visit (HOSPITAL_COMMUNITY): Payer: Self-pay

## 2023-04-15 NOTE — Telephone Encounter (Signed)
 Spoke to patient and informed him that follow up appointment is needed for additional refills for Metoprolol 100 mg. Patient verbalized understanding and was scheduled for 04/24/2023 at 11:00 am with Dr. Doreene Burke MD.

## 2023-04-24 ENCOUNTER — Other Ambulatory Visit: Payer: Self-pay

## 2023-04-24 ENCOUNTER — Encounter: Payer: Self-pay | Admitting: Family Medicine

## 2023-04-24 ENCOUNTER — Ambulatory Visit: Payer: Medicare HMO | Admitting: Family Medicine

## 2023-04-24 VITALS — BP 142/84 | HR 88 | Temp 97.9°F | Ht 66.0 in | Wt 187.4 lb

## 2023-04-24 DIAGNOSIS — I4891 Unspecified atrial fibrillation: Secondary | ICD-10-CM

## 2023-04-24 DIAGNOSIS — Z8739 Personal history of other diseases of the musculoskeletal system and connective tissue: Secondary | ICD-10-CM | POA: Diagnosis not present

## 2023-04-24 DIAGNOSIS — Z09 Encounter for follow-up examination after completed treatment for conditions other than malignant neoplasm: Secondary | ICD-10-CM | POA: Diagnosis not present

## 2023-04-24 DIAGNOSIS — I10 Essential (primary) hypertension: Secondary | ICD-10-CM | POA: Diagnosis not present

## 2023-04-24 LAB — URIC ACID: Uric Acid, Serum: 10.1 mg/dL — ABNORMAL HIGH (ref 4.0–7.8)

## 2023-04-24 MED ORDER — AMLODIPINE BESYLATE 10 MG PO TABS: 10.0000 mg | ORAL_TABLET | Freq: Every day | ORAL | 3 refills | Status: DC

## 2023-04-24 MED ORDER — METOPROLOL TARTRATE 100 MG PO TABS: 50.0000 mg | ORAL_TABLET | Freq: Two times a day (BID) | ORAL | 3 refills | Status: DC

## 2023-04-24 MED ORDER — LISINOPRIL 40 MG PO TABS: 40.0000 mg | ORAL_TABLET | Freq: Every day | ORAL | 3 refills | Status: DC

## 2023-04-24 MED ORDER — HYDROCHLOROTHIAZIDE 25 MG PO TABS: 25.0000 mg | ORAL_TABLET | Freq: Every day | ORAL | 3 refills | Status: DC

## 2023-04-24 NOTE — Telephone Encounter (Signed)
RX was sent to pharmacy as requested and patient was notified.

## 2023-04-24 NOTE — Progress Notes (Signed)
Established Patient Office Visit   Subjective:  Patient ID: Philip Donaldson, male    DOB: 1929-02-04  Age: 89 y.o. MRN: [id number]  Chief Complaint  Patient presents with   Medical Management of Chronic Issues    6 month follow up. Pt is fasting.     HPI Encounter Diagnoses  Name Primary?   Atrial fibrillation, unspecified type (HCC) Yes   Essential hypertension    History of gout    For follow-up.  Has been out of metoprolol.  Labs from November were reviewed.  Has voluntarily moved into assisted living.   Review of Systems  Constitutional: Negative.   HENT: Negative.    Eyes:  Negative for blurred vision, discharge and redness.  Respiratory: Negative.    Cardiovascular: Negative.   Gastrointestinal:  Negative for abdominal pain.  Genitourinary: Negative.   Musculoskeletal: Negative.  Negative for myalgias.  Skin:  Negative for rash.  Neurological:  Negative for tingling, loss of consciousness and weakness.  Endo/Heme/Allergies:  Negative for polydipsia.     Current Outpatient Medications:    apixaban (ELIQUIS) 5 MG TABS tablet, Take 1 tablet (5 mg total) by mouth 2 (two) times daily., Disp: 60 tablet, Rfl: 11   diclofenac Sodium (VOLTAREN) 1 % GEL, Apply a small grape sized dollop to sore joints on pends up to 4 times daily as needed., Disp: 150 g, Rfl: 2   lovastatin (MEVACOR) 40 MG tablet, TAKE 1 TABLET AT BEDTIME, Disp: 90 tablet, Rfl: 3   amLODipine (NORVASC) 10 MG tablet, Take 1 tablet (10 mg total) by mouth daily., Disp: 90 tablet, Rfl: 3   hydrochlorothiazide (HYDRODIURIL) 25 MG tablet, Take 1 tablet (25 mg total) by mouth daily., Disp: 90 tablet, Rfl: 3   lisinopril (ZESTRIL) 40 MG tablet, Take 1 tablet (40 mg total) by mouth daily., Disp: 90 tablet, Rfl: 3   metoprolol tartrate (LOPRESSOR) 100 MG tablet, Take 0.5 tablets (50 mg total) by mouth 2 (two) times daily., Disp: 90 tablet, Rfl: 3   Objective:     BP (!) 142/84   Pulse 88   Temp 97.9 F (36.6 C)    Ht 5\' 6"  (1.676 m)   Wt 187 lb 6.4 oz (85 kg)   SpO2 95%   BMI 30.25 kg/m  BP Readings from Last 3 Encounters:  04/24/23 (!) 142/84  02/13/23 (!) 174/74  07/11/22 (!) 142/74   Wt Readings from Last 3 Encounters:  04/24/23 187 lb 6.4 oz (85 kg)  02/13/23 183 lb (83 kg)  07/11/22 176 lb 0.6 oz (79.9 kg)      Physical Exam Constitutional:      General: He is not in acute distress.    Appearance: Normal appearance. He is not ill-appearing, toxic-appearing or diaphoretic.  HENT:     Head: Normocephalic and atraumatic.     Right Ear: External ear normal.     Left Ear: External ear normal.     Mouth/Throat:     Mouth: Mucous membranes are moist.     Pharynx: Oropharynx is clear. No oropharyngeal exudate or posterior oropharyngeal erythema.  Eyes:     General: No scleral icterus.       Right eye: No discharge.        Left eye: No discharge.     Extraocular Movements: Extraocular movements intact.     Conjunctiva/sclera: Conjunctivae normal.     Pupils: Pupils are equal, round, and reactive to light.  Cardiovascular:     Rate and Rhythm:  Normal rate. Rhythm irregularly irregular.  Pulmonary:     Effort: Pulmonary effort is normal. No respiratory distress.     Breath sounds: Normal breath sounds. No wheezing, rhonchi or rales.  Abdominal:     General: Bowel sounds are normal.     Tenderness: There is no abdominal tenderness. There is no guarding.  Musculoskeletal:     Cervical back: No rigidity or tenderness.  Skin:    General: Skin is warm and dry.  Neurological:     Mental Status: He is alert and oriented to person, place, and time.  Psychiatric:        Mood and Affect: Mood normal.        Behavior: Behavior normal.      No results found for any visits on 04/24/23.    The ASCVD Risk score (Arnett DK, et al., 2019) failed to calculate for the following reasons:   The 2019 ASCVD risk score is only valid for ages 20 to 49    Assessment & Plan:   Atrial  fibrillation, unspecified type (HCC) -     Metoprolol Tartrate; Take 0.5 tablets (50 mg total) by mouth 2 (two) times daily.  Dispense: 90 tablet; Refill: 3  Essential hypertension -     Metoprolol Tartrate; Take 0.5 tablets (50 mg total) by mouth 2 (two) times daily.  Dispense: 90 tablet; Refill: 3 -     amLODIPine Besylate; Take 1 tablet (10 mg total) by mouth daily.  Dispense: 90 tablet; Refill: 3 -     Lisinopril; Take 1 tablet (40 mg total) by mouth daily.  Dispense: 90 tablet; Refill: 3 -     hydroCHLOROthiazide; Take 1 tablet (25 mg total) by mouth daily.  Dispense: 90 tablet; Refill: 3  History of gout -     Uric acid    Return in about 3 months (around 07/23/2023).  Patient with distant history of gout and elevated uric acid.  He has had no gouty attacks in years.  For now he continues with HCTZ.  May need to consider discontinuation or addition of allopurinol with colchicine.  He has mild hypercalcemia which is likely from HCTZ.  Will follow this as well.  Restart metoprolol.  Mliss Sax, MD

## 2023-04-24 NOTE — Telephone Encounter (Signed)
Pt is needing his Lopressor sent to [facility] in: [facility] Departments: [facility]  [facility] Address: [address], [place], [place]  Phone: [phone].  He will be out tomorrow, he wants about a week's worth. By Then he should have his scripts through  the mail service.

## 2023-05-02 ENCOUNTER — Other Ambulatory Visit: Payer: Self-pay | Admitting: Cardiology

## 2023-05-02 DIAGNOSIS — H401112 Primary open-angle glaucoma, right eye, moderate stage: Secondary | ICD-10-CM | POA: Diagnosis not present

## 2023-05-02 DIAGNOSIS — H401121 Primary open-angle glaucoma, left eye, mild stage: Secondary | ICD-10-CM | POA: Diagnosis not present

## 2023-05-02 DIAGNOSIS — H4912 Fourth [trochlear] nerve palsy, left eye: Secondary | ICD-10-CM | POA: Diagnosis not present

## 2023-05-02 NOTE — Telephone Encounter (Signed)
Prescription refill request for Eliquis received. Indication:afib Last office visit:11/24 Scr:1.17  11/24 Age: 89 Weight:85  kg  Prescription refilled

## 2023-05-19 ENCOUNTER — Other Ambulatory Visit: Payer: Self-pay | Admitting: Family Medicine

## 2023-05-19 DIAGNOSIS — E7800 Pure hypercholesterolemia, unspecified: Secondary | ICD-10-CM

## 2023-07-24 ENCOUNTER — Encounter: Payer: Self-pay | Admitting: Family Medicine

## 2023-07-24 ENCOUNTER — Ambulatory Visit (INDEPENDENT_AMBULATORY_CARE_PROVIDER_SITE_OTHER): Payer: Medicare HMO | Admitting: Family Medicine

## 2023-07-24 VITALS — BP 130/80 | HR 65 | Temp 98.5°F | Resp 18 | Wt 186.6 lb

## 2023-07-24 DIAGNOSIS — I10 Essential (primary) hypertension: Secondary | ICD-10-CM | POA: Diagnosis not present

## 2023-07-24 DIAGNOSIS — I4891 Unspecified atrial fibrillation: Secondary | ICD-10-CM

## 2023-07-24 DIAGNOSIS — Z8739 Personal history of other diseases of the musculoskeletal system and connective tissue: Secondary | ICD-10-CM | POA: Diagnosis not present

## 2023-07-24 DIAGNOSIS — Z23 Encounter for immunization: Secondary | ICD-10-CM

## 2023-07-24 LAB — BASIC METABOLIC PANEL WITH GFR
BUN: 18 mg/dL (ref 6–23)
CO2: 32 meq/L (ref 19–32)
Calcium: 10.2 mg/dL (ref 8.4–10.5)
Chloride: 98 meq/L (ref 96–112)
Creatinine, Ser: 1.12 mg/dL (ref 0.40–1.50)
GFR: 56.81 mL/min — ABNORMAL LOW (ref >60.00)
Glucose, Bld: 89 mg/dL (ref 70–99)
Potassium: 3.9 meq/L (ref 3.5–5.1)
Sodium: 136 meq/L (ref 135–145)

## 2023-07-24 NOTE — Progress Notes (Signed)
 Established Patient Office Visit   Subjective:  Patient ID: Philip Donaldson, male    DOB: 04-08-1929  Age: 89 y.o. MRN: [id number]  Chief Complaint  Patient presents with   Medical Management of Chronic Issues    3 month follow up Pt isn't fasting; requesting Prevnar 20 vaccine due to age    HPI Encounter Diagnoses  Name Primary?   Essential hypertension Yes   Atrial fibrillation, unspecified type (HCC)    History of gout    Immunization due    Hypercalcemia    Here with his daughter today, Philip Donaldson.  Continues on medications as above.  Blood pressure is well-controlled on current regimen of amlodipine and lisinopril and chlorthalidone.  He has not had a gouty attack in many many years.  He had a pneumonia vaccine years ago that caused swelling in his arm at the injection site.  This resolved over a few days   Review of Systems  Constitutional: Negative.   HENT: Negative.    Eyes:  Negative for blurred vision, discharge and redness.  Respiratory: Negative.    Cardiovascular: Negative.   Gastrointestinal:  Negative for abdominal pain.  Genitourinary: Negative.   Musculoskeletal: Negative.  Negative for myalgias.  Skin:  Negative for rash.  Neurological:  Negative for tingling, loss of consciousness and weakness.  Endo/Heme/Allergies:  Negative for polydipsia.     Current Outpatient Medications:    amLODipine (NORVASC) 10 MG tablet, Take 1 tablet (10 mg total) by mouth daily., Disp: 90 tablet, Rfl: 3   diclofenac Sodium (VOLTAREN) 1 % GEL, Apply a small grape sized dollop to sore joints on pends up to 4 times daily as needed., Disp: 150 g, Rfl: 2   ELIQUIS 5 MG TABS tablet, TAKE 1 TABLET TWICE DAILY, Disp: 180 tablet, Rfl: 3   hydrochlorothiazide (HYDRODIURIL) 25 MG tablet, Take 1 tablet (25 mg total) by mouth daily., Disp: 90 tablet, Rfl: 3   lisinopril (ZESTRIL) 40 MG tablet, Take 1 tablet (40 mg total) by mouth daily., Disp: 90 tablet, Rfl: 3   lovastatin (MEVACOR) 40 MG  tablet, TAKE 1 TABLET AT BEDTIME, Disp: 90 tablet, Rfl: 3   metoprolol tartrate (LOPRESSOR) 100 MG tablet, Take 0.5 tablets (50 mg total) by mouth 2 (two) times daily., Disp: 90 tablet, Rfl: 3   timolol (TIMOPTIC) 0.5 % ophthalmic solution, SMARTSIG:In Eye(s), Disp: , Rfl:    Objective:     BP 130/80 (BP Location: Right Arm, Patient Position: Sitting, Cuff Size: Normal) Comment: recheck after resting  Pulse 65   Temp 98.5 F (36.9 C) (Temporal)   Resp 18   Wt 186 lb 9.6 oz (84.6 kg)   SpO2 95%   BMI 30.12 kg/m    Physical Exam Constitutional:      General: He is not in acute distress.    Appearance: Normal appearance. He is not ill-appearing, toxic-appearing or diaphoretic.  HENT:     Head: Normocephalic and atraumatic.     Right Ear: External ear normal.     Left Ear: External ear normal.  Eyes:     General: No scleral icterus.       Right eye: No discharge.        Left eye: No discharge.     Extraocular Movements: Extraocular movements intact.     Conjunctiva/sclera: Conjunctivae normal.  Cardiovascular:     Rate and Rhythm: Rhythm irregularly irregular.  Pulmonary:     Effort: Pulmonary effort is normal. No respiratory distress.  Breath sounds: No wheezing, rhonchi or rales.  Skin:    General: Skin is warm and dry.  Neurological:     Mental Status: He is alert and oriented to person, place, and time.  Psychiatric:        Mood and Affect: Mood normal.        Behavior: Behavior normal.      No results found for any visits on 07/24/23.    The ASCVD Risk score (Arnett DK, et al., 2019) failed to calculate for the following reasons:   The 2019 ASCVD risk score is only valid for ages 57 to 90    Assessment & Plan:   Essential hypertension -     Basic metabolic panel with GFR  Atrial fibrillation, unspecified type (HCC)  History of gout  Immunization due -     Pneumococcal conjugate vaccine 20-valent  Hypercalcemia -     Basic metabolic panel with  GFR -     Parathyroid hormone, intact (no Ca)    No follow-ups on file.    Tonna Frederic, MD

## 2023-07-25 LAB — PARATHYROID HORMONE, INTACT (NO CA): PTH: 52 pg/mL (ref 16–77)

## 2023-08-14 ENCOUNTER — Other Ambulatory Visit: Payer: Self-pay

## 2023-08-15 ENCOUNTER — Encounter: Payer: Self-pay | Admitting: Cardiology

## 2023-08-15 ENCOUNTER — Ambulatory Visit: Attending: Cardiology | Admitting: Cardiology

## 2023-08-15 VITALS — BP 138/74 | HR 59 | Ht 66.0 in | Wt 190.4 lb

## 2023-08-15 DIAGNOSIS — I4819 Other persistent atrial fibrillation: Secondary | ICD-10-CM

## 2023-08-15 DIAGNOSIS — I10 Essential (primary) hypertension: Secondary | ICD-10-CM

## 2023-08-15 DIAGNOSIS — E7800 Pure hypercholesterolemia, unspecified: Secondary | ICD-10-CM

## 2023-08-15 DIAGNOSIS — E782 Mixed hyperlipidemia: Secondary | ICD-10-CM | POA: Diagnosis not present

## 2023-08-15 NOTE — Patient Instructions (Signed)

## 2023-08-15 NOTE — Progress Notes (Signed)
 Cardiology Office Note:    Date:  08/15/2023   ID:  Philip Donaldson, DOB [birth year], MRN [id number]  PCP:  Tonna Frederic, MD  Cardiologist:  Nelia Balzarine, MD   Referring MD: Tonna Frederic,*    ASSESSMENT:    1. Hypercholesterolemia   2. Essential hypertension   3. Persistent atrial fibrillation (HCC)   4. Mixed hyperlipidemia    PLAN:    In order of problems listed above:  Primary prevention stressed with the patient.  Importance of compliance with diet medication stressed and patient verbalized standing.  He was advised to ambulate to the best of his ability. Paroxysmal atrial fibrillation:I discussed with the patient atrial fibrillation, disease process. Management and therapy including rate and rhythm control, anticoagulation benefits and potential risks were discussed extensively with the patient. Patient had multiple questions which were answered to patient's satisfaction.  He ambulates with a cane but is overall stable on his feet.  89  He has not had any falls. Essential hypertension: Blood pressure stable and diet was emphasized. Mixed dyslipidemia on lipid-lowering medications followed by primary care. Patient will be seen in follow-up appointment in 12 months or earlier if the patient has any concerns.    Medication Adjustments/Labs and Tests Ordered: Current medicines are reviewed at length with the patient today.  Concerns regarding medicines are outlined above.  Orders Placed This Encounter  Procedures   EKG 12-Lead   No orders of the defined types were placed in this encounter.    No chief complaint on file.    History of Present Illness:    Philip Donaldson is a 89 y.o. male.  Patient has past medical history of essential hypertension, persistent atrial fibrillation and mixed dyslipidemia.  He denies any problems at this time and takes care of activities of daily living.  No chest pain orthopnea or PND.  At the time of my evaluation, the  patient is alert awake oriented and in no distress.  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   Cancer (HCC)    skin Ca-melanoma- scalp   Chronic kidney disease, stage 3 unspecified (HCC) 05/29/2022   Diverticulitis    Elevated LDL cholesterol level 04/06/2021   Essential hypertension 01/31/2020   Gout 02/02/2012   History of gout 01/31/2020   Hypercholesterolemia 05/29/2022   Hyperlipidemia    Hypertension    Mass of arm, left 01/31/2020   New onset atrial fibrillation (HCC) 06/04/2022   Pain of right hand 04/29/2022   Tubular adenoma of colon 05/10/2011    Past Surgical History:  Procedure Laterality Date   COLONOSCOPY     POLYPECTOMY     SKIN CANCER EXCISION     to scalp    Current Medications: Current Meds  Medication Sig   amLODipine  (NORVASC ) 10 MG tablet Take 1 tablet (10 mg total) by mouth daily.   apixaban  (ELIQUIS ) 5 MG TABS tablet Take 5 mg by mouth 2 (two) times daily.   hydrochlorothiazide  (HYDRODIURIL ) 25 MG tablet Take 1 tablet (25 mg total) by mouth daily.   lisinopril  (ZESTRIL ) 40 MG tablet Take 1 tablet (40 mg total) by mouth daily.   lovastatin  (MEVACOR ) 40 MG tablet TAKE 1 TABLET AT BEDTIME   metoprolol  tartrate (LOPRESSOR ) 100 MG tablet Take 0.5 tablets (50 mg total) by mouth 2 (two) times daily.   timolol (TIMOPTIC) 0.5 % ophthalmic solution Place 1 drop into both eyes daily.  Allergies:   Clarithromycin, Indomethacin, Pneumococcal vaccines, and Tuberculin tests   Social History   Socioeconomic History   Marital status: Widowed    Spouse name: Not on file   Number of children: 3   Years of education: Not on file   Highest education level: Not on file  Occupational History   Occupation: Retired   Tobacco Use   Smoking status: Former   Smokeless tobacco: Never  Advertising account planner   Vaping status: Never Used  Substance and Sexual Activity   Alcohol use: No   Drug use:  No   Sexual activity: Not on file  Other Topics Concern   Not on file  Social History Narrative   Daily caffeine    Social Drivers of Health   Financial Resource Strain: Low Risk  (11/11/2022)   Overall Financial Resource Strain (CARDIA)    Difficulty of Paying Living Expenses: Not hard at all  Food Insecurity: No Food Insecurity (11/11/2022)   Hunger Vital Sign    Worried About Running Out of Food in the Last Year: Never true    Ran Out of Food in the Last Year: Never true  Transportation Needs: No Transportation Needs (01/16/2023)   PRAPARE - Administrator, Civil Service (Medical): No    Lack of Transportation (Non-Medical): No  Physical Activity: Sufficiently Active (11/11/2022)   Exercise Vital Sign    Days of Exercise per Week: 7 days    Minutes of Exercise per Session: 30 min  Stress: No Stress Concern Present (11/11/2022)   [employer] of Occupational Health - Occupational Stress Questionnaire    Feeling of Stress : Not at all  Social Connections: Moderately Isolated (11/11/2022)   Social Connection and Isolation Panel [NHANES]    Frequency of Communication with Friends and Family: More than three times a week    Frequency of Social Gatherings with Friends and Family: Twice a week    Attends Religious Services: 1 to 4 times per year    Active Member of [employer] or Organizations: No    Attends Banker Meetings: Never    Marital Status: Widowed     Family History: The patient's family history is negative for Colon cancer, Esophageal cancer, Rectal cancer, and Stomach cancer.  ROS:   Please see the history of present illness.    All other systems reviewed and are negative.  EKGs/Labs/Other Studies Reviewed:    The following studies were reviewed today: .Aaron AasEKG Interpretation Date/Time:  Friday Aug 15 2023 11:24:08 EDT Ventricular Rate:  59 PR Interval:    QRS Duration:  96 QT Interval:  424 QTC Calculation: 419 R Axis:   -55  Text  Interpretation: Atrial fibrillation with slow ventricular response Low voltage QRS Left anterior fascicular block Cannot rule out Inferior infarct (masked by fascicular block?) , age undetermined Cannot rule out Anterior infarct , age undetermined When compared with ECG of 16-Oct-2009 22:34, Atrial fibrillation has replaced Sinus rhythm Left anterior fascicular block is now Present Minimal criteria for Anterior infarct are now Present Nonspecific T wave abnormality now evident in Lateral leads Confirmed by Hillis Lu [phone]) on 08/15/2023 11:40:00 AM     Recent Labs: 02/13/2023: ALT 22; Hemoglobin 16.5; Platelets 207; TSH 4.240 07/24/2023: BUN 18; Creatinine, Ser 1.12; Potassium 3.9; Sodium 136  Recent Lipid Panel    Component Value Date/Time   CHOL 160 02/13/2023 1049   TRIG 119 02/13/2023 1049   HDL 50 02/13/2023 1049   CHOLHDL 3.2 02/13/2023 1049  CHOLHDL 3 10/25/2021 1129   VLDL 19.8 10/25/2021 1129   LDLCALC 89 02/13/2023 1049   LDLDIRECT 79.0 04/06/2021 1103    Physical Exam:    VS:  BP 138/74   Pulse (!) 59   Ht 5\' 6"  (1.676 m)   Wt 190 lb 6.4 oz (86.4 kg)   SpO2 97%   BMI 30.73 kg/m     Wt Readings from Last 3 Encounters:  08/15/23 190 lb 6.4 oz (86.4 kg)  07/24/23 186 lb 9.6 oz (84.6 kg)  04/24/23 187 lb 6.4 oz (85 kg)     GEN: Patient is in no acute distress HEENT: Normal NECK: No JVD; No carotid bruits LYMPHATICS: No lymphadenopathy CARDIAC: Hear sounds regular, 2/6 systolic murmur at the apex. RESPIRATORY:  Clear to auscultation without rales, wheezing or rhonchi  ABDOMEN: Soft, non-tender, non-distended MUSCULOSKELETAL:  No edema; No deformity  SKIN: Warm and dry NEUROLOGIC:  Alert and oriented x 3 PSYCHIATRIC:  Normal affect   Signed, Nelia Balzarine, MD  08/15/2023 11:51 AM    [facility]

## 2023-09-04 DIAGNOSIS — H11131 Conjunctival pigmentations, right eye: Secondary | ICD-10-CM | POA: Diagnosis not present

## 2023-09-04 DIAGNOSIS — H353111 Nonexudative age-related macular degeneration, right eye, early dry stage: Secondary | ICD-10-CM | POA: Diagnosis not present

## 2023-09-04 DIAGNOSIS — H401112 Primary open-angle glaucoma, right eye, moderate stage: Secondary | ICD-10-CM | POA: Diagnosis not present

## 2023-09-04 DIAGNOSIS — H401121 Primary open-angle glaucoma, left eye, mild stage: Secondary | ICD-10-CM | POA: Diagnosis not present

## 2023-09-08 ENCOUNTER — Ambulatory Visit: Payer: Self-pay

## 2023-09-08 NOTE — Telephone Encounter (Signed)
 Pt daughter calling on behalf of pt, Philip Donaldson.   Chief Complaint: weakness Symptoms: general weakness, lightheadedness Frequency: started yesterday Pertinent Negatives: Patient denies CP, CMS changes, GU s/s,  Disposition: [] ED /[] Urgent Care (no appt availability in office) / [x] Appointment(In office/virtual)/ []  [facility]/ [] Home Care/ [] Refused Recommended Disposition /[] [facility]/ []  Follow-up with PCP Additional Notes: pt states that he was sick a few months ago. Pt states that he feels weak. Still performing ADLs, states that he has a cough but that it is not worse than normal. Pt states that he believes he is keeping up on his fluids. Pt states that his breathing feels heavy, but states that it does not feel like a heaviness on his chest. SPO2-96%, HR 78, BP 149/76. Scheduled.   Copied from CRM [id number]. Topic: Clinical - Red Word Triage >> Sep 08, 2023 11:04 AM Emmet Harm C wrote: Red Word that prompted transfer to Nurse Triage: feeling weak, having a hard time breathing, and breathing heavy Reason for Disposition  Poor fluid intake probably caused the weakness  Answer Assessment - Initial Assessment Questions 1. DESCRIPTION: "Describe how you are feeling."     Weakness and "heaviness" 2. SEVERITY: "How bad is it?"  "Can you stand and walk?"   - MILD (0-3): Feels weak or tired, but does not interfere with work, school or normal activities.   - MODERATE (4-7): Able to stand and walk; weakness interferes with work, school, or normal activities.   - SEVERE (8-10): Unable to stand or walk; unable to do usual activities.     mild 3. ONSET: "When did these symptoms begin?" (e.g., hours, days, weeks, months)     yesterday 4. CAUSE: "What do you think is causing the weakness or fatigue?" (e.g., not drinking enough fluids, medical problem, trouble sleeping)     Daughter states probably is not drinking enough water 5. NEW MEDICINES:  "Have you started on any new  medicines recently?" (e.g., opioid pain medicines, benzodiazepines, muscle relaxants, antidepressants, antihistamines, neuroleptics, beta blockers)     denies 6. OTHER SYMPTOMS: "Do you have any other symptoms?" (e.g., chest pain, fever, cough, SOB, vomiting, diarrhea, bleeding, other areas of pain)     States heaviness to his breathing.  Protocols used: Weakness (Generalized) and Fatigue-A-AH

## 2023-09-09 ENCOUNTER — Ambulatory Visit: Payer: Self-pay | Admitting: Family Medicine

## 2023-09-09 ENCOUNTER — Emergency Department (HOSPITAL_COMMUNITY)

## 2023-09-09 ENCOUNTER — Encounter: Payer: Self-pay | Admitting: Family Medicine

## 2023-09-09 ENCOUNTER — Ambulatory Visit (INDEPENDENT_AMBULATORY_CARE_PROVIDER_SITE_OTHER): Admitting: Family Medicine

## 2023-09-09 ENCOUNTER — Inpatient Hospital Stay (HOSPITAL_COMMUNITY)
Admission: EM | Admit: 2023-09-09 | Discharge: 2023-09-14 | DRG: 418 | Disposition: A | Discharge status: Home / Self Care | Attending: Family Medicine | Admitting: Family Medicine

## 2023-09-09 ENCOUNTER — Encounter (HOSPITAL_COMMUNITY): Payer: Self-pay

## 2023-09-09 VITALS — BP 120/80 | HR 82 | Temp 97.8°F | Ht 66.0 in | Wt 192.2 lb

## 2023-09-09 DIAGNOSIS — R5383 Other fatigue: Secondary | ICD-10-CM | POA: Insufficient documentation

## 2023-09-09 DIAGNOSIS — I480 Paroxysmal atrial fibrillation: Secondary | ICD-10-CM | POA: Diagnosis present

## 2023-09-09 DIAGNOSIS — R7989 Other specified abnormal findings of blood chemistry: Secondary | ICD-10-CM | POA: Diagnosis not present

## 2023-09-09 DIAGNOSIS — I159 Secondary hypertension, unspecified: Secondary | ICD-10-CM

## 2023-09-09 DIAGNOSIS — N1831 Chronic kidney disease, stage 3a: Secondary | ICD-10-CM | POA: Diagnosis present

## 2023-09-09 DIAGNOSIS — Z881 Allergy status to other antibiotic agents status: Secondary | ICD-10-CM

## 2023-09-09 DIAGNOSIS — K5730 Diverticulosis of large intestine without perforation or abscess without bleeding: Secondary | ICD-10-CM | POA: Diagnosis not present

## 2023-09-09 DIAGNOSIS — R7401 Elevation of levels of liver transaminase levels: Secondary | ICD-10-CM | POA: Diagnosis present

## 2023-09-09 DIAGNOSIS — I129 Hypertensive chronic kidney disease with stage 1 through stage 4 chronic kidney disease, or unspecified chronic kidney disease: Secondary | ICD-10-CM | POA: Diagnosis present

## 2023-09-09 DIAGNOSIS — N183 Chronic kidney disease, stage 3 unspecified: Secondary | ICD-10-CM | POA: Diagnosis not present

## 2023-09-09 DIAGNOSIS — K810 Acute cholecystitis: Secondary | ICD-10-CM | POA: Diagnosis present

## 2023-09-09 DIAGNOSIS — K8000 Calculus of gallbladder with acute cholecystitis without obstruction: Secondary | ICD-10-CM | POA: Diagnosis present

## 2023-09-09 DIAGNOSIS — Z87891 Personal history of nicotine dependence: Secondary | ICD-10-CM | POA: Diagnosis not present

## 2023-09-09 DIAGNOSIS — Z887 Allergy status to serum and vaccine status: Secondary | ICD-10-CM

## 2023-09-09 DIAGNOSIS — Z888 Allergy status to other drugs, medicaments and biological substances status: Secondary | ICD-10-CM | POA: Diagnosis not present

## 2023-09-09 DIAGNOSIS — Z7901 Long term (current) use of anticoagulants: Secondary | ICD-10-CM | POA: Diagnosis not present

## 2023-09-09 DIAGNOSIS — I10 Essential (primary) hypertension: Secondary | ICD-10-CM | POA: Diagnosis present

## 2023-09-09 DIAGNOSIS — R630 Anorexia: Secondary | ICD-10-CM | POA: Diagnosis present

## 2023-09-09 DIAGNOSIS — R0609 Other forms of dyspnea: Secondary | ICD-10-CM | POA: Diagnosis not present

## 2023-09-09 DIAGNOSIS — R0602 Shortness of breath: Secondary | ICD-10-CM | POA: Insufficient documentation

## 2023-09-09 DIAGNOSIS — Q441 Other congenital malformations of gallbladder: Secondary | ICD-10-CM

## 2023-09-09 DIAGNOSIS — I4819 Other persistent atrial fibrillation: Secondary | ICD-10-CM | POA: Diagnosis present

## 2023-09-09 DIAGNOSIS — K449 Diaphragmatic hernia without obstruction or gangrene: Secondary | ICD-10-CM | POA: Diagnosis not present

## 2023-09-09 DIAGNOSIS — I4891 Unspecified atrial fibrillation: Secondary | ICD-10-CM | POA: Diagnosis not present

## 2023-09-09 DIAGNOSIS — Z85828 Personal history of other malignant neoplasm of skin: Secondary | ICD-10-CM

## 2023-09-09 DIAGNOSIS — Z79899 Other long term (current) drug therapy: Secondary | ICD-10-CM | POA: Diagnosis not present

## 2023-09-09 DIAGNOSIS — K5900 Constipation, unspecified: Secondary | ICD-10-CM | POA: Diagnosis not present

## 2023-09-09 DIAGNOSIS — Z860101 Personal history of adenomatous and serrated colon polyps: Secondary | ICD-10-CM | POA: Diagnosis not present

## 2023-09-09 DIAGNOSIS — E876 Hypokalemia: Secondary | ICD-10-CM | POA: Diagnosis present

## 2023-09-09 DIAGNOSIS — E7800 Pure hypercholesterolemia, unspecified: Secondary | ICD-10-CM | POA: Diagnosis present

## 2023-09-09 DIAGNOSIS — E785 Hyperlipidemia, unspecified: Secondary | ICD-10-CM | POA: Diagnosis present

## 2023-09-09 DIAGNOSIS — K8020 Calculus of gallbladder without cholecystitis without obstruction: Secondary | ICD-10-CM | POA: Diagnosis not present

## 2023-09-09 DIAGNOSIS — Z85820 Personal history of malignant melanoma of skin: Secondary | ICD-10-CM | POA: Diagnosis not present

## 2023-09-09 DIAGNOSIS — K819 Cholecystitis, unspecified: Principal | ICD-10-CM

## 2023-09-09 DIAGNOSIS — K828 Other specified diseases of gallbladder: Secondary | ICD-10-CM | POA: Diagnosis not present

## 2023-09-09 LAB — URINALYSIS, ROUTINE W REFLEX MICROSCOPIC
Bilirubin Urine: NEGATIVE
Ketones, ur: NEGATIVE
Leukocytes,Ua: NEGATIVE
Nitrite: NEGATIVE
Specific Gravity, Urine: 1.01 (ref 1.000–1.030)
Urine Glucose: NEGATIVE
Urobilinogen, UA: 0.2 (ref 0.0–1.0)
pH: 6 (ref 5.0–8.0)

## 2023-09-09 LAB — COMPREHENSIVE METABOLIC PANEL WITH GFR
ALT: 64 U/L — ABNORMAL HIGH (ref 0–53)
AST: 79 U/L — ABNORMAL HIGH (ref 0–37)
Albumin: 3.4 g/dL — ABNORMAL LOW (ref 3.5–5.2)
Alkaline Phosphatase: 141 U/L — ABNORMAL HIGH (ref 39–117)
BUN: 18 mg/dL (ref 6–23)
CO2: 32 meq/L (ref 19–32)
Calcium: 9.1 mg/dL (ref 8.4–10.5)
Chloride: 95 meq/L — ABNORMAL LOW (ref 96–112)
Creatinine, Ser: 1.11 mg/dL (ref 0.40–1.50)
GFR: 57.37 mL/min — ABNORMAL LOW (ref >60.00)
Glucose, Bld: 121 mg/dL — ABNORMAL HIGH (ref 70–99)
Potassium: 2.9 meq/L — ABNORMAL LOW (ref 3.5–5.1)
Sodium: 135 meq/L (ref 135–145)
Total Bilirubin: 1 mg/dL (ref 0.2–1.2)
Total Protein: 6.2 g/dL (ref 6.0–8.3)

## 2023-09-09 LAB — CBC WITH DIFFERENTIAL/PLATELET
Basophils Absolute: 0 10*3/uL (ref 0.0–0.1)
Basophils Relative: 0.3 % (ref 0.0–3.0)
Eosinophils Absolute: 0.2 10*3/uL (ref 0.0–0.7)
Eosinophils Relative: 1.9 % (ref 0.0–5.0)
HCT: 42.4 % (ref 39.0–52.0)
Hemoglobin: 14.2 g/dL (ref 13.0–17.0)
Lymphocytes Relative: 12.3 % (ref 12.0–46.0)
Lymphs Abs: 1.5 10*3/uL (ref 0.7–4.0)
MCHC: 33.6 g/dL (ref 30.0–36.0)
MCV: 92.4 fl (ref 78.0–100.0)
Monocytes Absolute: 1.4 10*3/uL — ABNORMAL HIGH (ref 0.1–1.0)
Monocytes Relative: 11.2 % (ref 3.0–12.0)
Neutro Abs: 9.4 10*3/uL — ABNORMAL HIGH (ref 1.4–7.7)
Neutrophils Relative %: 74.3 % (ref 43.0–77.0)
Platelets: 251 10*3/uL (ref 150.0–400.0)
RBC: 4.59 Mil/uL (ref 4.22–5.81)
RDW: 13.7 % (ref 11.5–15.5)
WBC: 12.6 10*3/uL — ABNORMAL HIGH (ref 4.0–10.5)

## 2023-09-09 LAB — BRAIN NATRIURETIC PEPTIDE: Pro B Natriuretic peptide (BNP): 136 pg/mL — ABNORMAL HIGH (ref 0.0–100.0)

## 2023-09-09 LAB — LIPASE, BLOOD: Lipase: 49 U/L (ref 11–51)

## 2023-09-09 MED ORDER — POTASSIUM CHLORIDE CRYS ER 20 MEQ PO TBCR: 20.0000 meq | EXTENDED_RELEASE_TABLET | Freq: Every day | ORAL | 3 refills | Status: DC

## 2023-09-09 MED ORDER — POLYETHYLENE GLYCOL 3350 17 GM/SCOOP PO POWD: 17.0000 g | Freq: Two times a day (BID) | ORAL | 1 refills | Status: DC | PRN

## 2023-09-09 MED ORDER — PIPERACILLIN-TAZOBACTAM 3.375 G IVPB 30 MIN
3.3750 g | Freq: Once | INTRAVENOUS | Status: AC
  Administered 2023-09-09: 3.375 g via INTRAVENOUS
  Filled 2023-09-09: qty 50

## 2023-09-09 MED ORDER — IOHEXOL 300 MG/ML  SOLN
100.0000 mL | Freq: Once | INTRAMUSCULAR | Status: AC | PRN
  Administered 2023-09-09: 100 mL via INTRAVENOUS

## 2023-09-09 MED ORDER — POTASSIUM CHLORIDE CRYS ER 20 MEQ PO TBCR
40.0000 meq | EXTENDED_RELEASE_TABLET | Freq: Once | ORAL | Status: AC
  Administered 2023-09-09: 40 meq via ORAL
  Filled 2023-09-09: qty 2

## 2023-09-09 NOTE — Progress Notes (Addendum)
 Established Patient Office Visit   Subjective:  Patient ID: Philip Donaldson, male    DOB: 11-Feb-1929  Age: 89 y.o. MRN: [id number]  Chief Complaint  Patient presents with   Extremity Weakness    Whole body feels weak for the past couple weeks worst on Sunday     Extremity Weakness  Pertinent negatives include no fever or tingling.   Encounter Diagnoses  Name Primary?   Other fatigue Yes   DOE (dyspnea on exertion)    SOB (shortness of breath)    Constipation, unspecified constipation type    Hypokalemia    2 to 3-week history of fatigue, shortness of breath, dyspnea with exertion and decreased appetite.  Denies headaches, fevers chills, congestion, postnasal drip.  Status post cough that has resolved.  Denies chest pain nausea or vomiting.  He has been constipated since he stopped eating oatmeal.  Some mild relief with a laxative.  He sleeps on 1 pillow but sometimes becomes short of breath when lying down.  Past medical history of atrial fib, hypertension, CKD, and elevated LDL.  Echocardiogram from March of last year was essentially normal.   Review of Systems  Constitutional:  Positive for malaise/fatigue. Negative for chills and fever.  HENT: Negative.    Eyes:  Negative for blurred vision, discharge and redness.  Respiratory:  Positive for shortness of breath. Negative for cough, sputum production and wheezing.   Cardiovascular: Negative.  Negative for chest pain and palpitations.  Gastrointestinal:  Positive for constipation. Negative for abdominal pain, blood in stool, melena, nausea and vomiting.  Genitourinary: Negative.   Musculoskeletal:  Positive for extremity weakness. Negative for myalgias.  Skin:  Negative for rash.  Neurological:  Negative for tingling, loss of consciousness, weakness and headaches.  Endo/Heme/Allergies:  Negative for polydipsia.  Psychiatric/Behavioral: Negative.       Current Outpatient Medications:    amLODipine  (NORVASC ) 10 MG tablet,  Take 1 tablet (10 mg total) by mouth daily., Disp: 90 tablet, Rfl: 3   apixaban  (ELIQUIS ) 5 MG TABS tablet, Take 5 mg by mouth 2 (two) times daily., Disp: , Rfl:    hydrochlorothiazide  (HYDRODIURIL ) 25 MG tablet, Take 1 tablet (25 mg total) by mouth daily., Disp: 90 tablet, Rfl: 3   lisinopril  (ZESTRIL ) 40 MG tablet, Take 1 tablet (40 mg total) by mouth daily., Disp: 90 tablet, Rfl: 3   lovastatin  (MEVACOR ) 40 MG tablet, TAKE 1 TABLET AT BEDTIME, Disp: 90 tablet, Rfl: 3   metoprolol  tartrate (LOPRESSOR ) 100 MG tablet, Take 0.5 tablets (50 mg total) by mouth 2 (two) times daily., Disp: 90 tablet, Rfl: 3   polyethylene glycol powder (GLYCOLAX/MIRALAX) 17 GM/SCOOP powder, Take 17 g by mouth 2 (two) times daily as needed., Disp: 3350 g, Rfl: 1   potassium chloride SA (KLOR-CON M) 20 MEQ tablet, Take 1 tablet (20 mEq total) by mouth daily., Disp: 30 tablet, Rfl: 3   timolol (TIMOPTIC) 0.5 % ophthalmic solution, Place 1 drop into both eyes daily., Disp: , Rfl:    Objective:     BP 120/80 (BP Location: Left Arm, Patient Position: Sitting, Cuff Size: Normal)   Pulse 82   Temp 97.8 F (36.6 C) (Temporal)   Ht 5\' 6"  (1.676 m)   Wt 192 lb 3.2 oz (87.2 kg)   SpO2 97%   BMI 31.02 kg/m  BP Readings from Last 3 Encounters:  09/09/23 120/80  08/15/23 138/74  07/24/23 130/80   Wt Readings from Last 3 Encounters:  09/09/23 192 lb  3.2 oz (87.2 kg)  08/15/23 190 lb 6.4 oz (86.4 kg)  07/24/23 186 lb 9.6 oz (84.6 kg)      Physical Exam Constitutional:      General: He is not in acute distress.    Appearance: Normal appearance. He is not ill-appearing, toxic-appearing or diaphoretic.  HENT:     Head: Normocephalic and atraumatic.     Right Ear: External ear normal.     Left Ear: External ear normal.     Mouth/Throat:     Mouth: Mucous membranes are moist.     Pharynx: Oropharynx is clear. No oropharyngeal exudate or posterior oropharyngeal erythema.  Eyes:     General: No scleral icterus.        Right eye: No discharge.        Left eye: No discharge.     Extraocular Movements: Extraocular movements intact.     Conjunctiva/sclera: Conjunctivae normal.     Pupils: Pupils are equal, round, and reactive to light.  Cardiovascular:     Rate and Rhythm: Normal rate. Rhythm irregularly irregular. No extrasystoles are present. Pulmonary:     Effort: Pulmonary effort is normal. No respiratory distress.     Breath sounds: Normal breath sounds. No wheezing, rhonchi or rales.  Abdominal:     General: Bowel sounds are normal.     Tenderness: There is no abdominal tenderness. There is no guarding.    Musculoskeletal:     Cervical back: No rigidity or tenderness.     Right lower leg: No edema.     Left lower leg: No edema.     Right ankle: Swelling present.     Left ankle: Swelling present.  Lymphadenopathy:     Cervical: No cervical adenopathy.  Skin:    General: Skin is warm and dry.  Neurological:     Mental Status: He is alert and oriented to person, place, and time.  Psychiatric:        Mood and Affect: Mood normal.        Behavior: Behavior normal.      Results for orders placed or performed in visit on 09/09/23  CBC with Differential/Platelet  Result Value Ref Range   WBC 12.6 (H) 4.0 - 10.5 K/uL   RBC 4.59 4.22 - 5.81 Mil/uL   Hemoglobin 14.2 13.0 - 17.0 g/dL   HCT 47.8 29.5 - 62.1 %   MCV 92.4 78.0 - 100.0 fl   MCHC 33.6 30.0 - 36.0 g/dL   RDW 30.8 65.7 - 84.6 %   Platelets 251.0 150.0 - 400.0 K/uL   Neutrophils Relative % 74.3 43.0 - 77.0 %   Lymphocytes Relative 12.3 12.0 - 46.0 %   Monocytes Relative 11.2 3.0 - 12.0 %   Eosinophils Relative 1.9 0.0 - 5.0 %   Basophils Relative 0.3 0.0 - 3.0 %   Neutro Abs 9.4 (H) 1.4 - 7.7 K/uL   Lymphs Abs 1.5 0.7 - 4.0 K/uL   Monocytes Absolute 1.4 (H) 0.1 - 1.0 K/uL   Eosinophils Absolute 0.2 0.0 - 0.7 K/uL   Basophils Absolute 0.0 0.0 - 0.1 K/uL  Comprehensive metabolic panel with GFR  Result Value Ref Range    Sodium 135 135 - 145 mEq/L   Potassium 2.9 (L) 3.5 - 5.1 mEq/L   Chloride 95 (L) 96 - 112 mEq/L   CO2 32 19 - 32 mEq/L   Glucose, Bld 121 (H) 70 - 99 mg/dL   BUN 18 6 - 23 mg/dL  Creatinine, Ser 1.11 0.40 - 1.50 mg/dL   Total Bilirubin 1.0 0.2 - 1.2 mg/dL   Alkaline Phosphatase 141 (H) 39 - 117 U/L   AST 79 (H) 0 - 37 U/L   ALT 64 (H) 0 - 53 U/L   Total Protein 6.2 6.0 - 8.3 g/dL   Albumin 3.4 (L) 3.5 - 5.2 g/dL   GFR 74.25 (L) >95.63 mL/min   Calcium 9.1 8.4 - 10.5 mg/dL  B Nat Peptide  Result Value Ref Range   Pro B Natriuretic peptide (BNP) 136.0 (H) 0.0 - 100.0 pg/mL  Urinalysis, Routine w reflex microscopic  Result Value Ref Range   Color, Urine YELLOW Yellow;Lt. Yellow;Straw;Dark Yellow;Amber;Green;Red;Brown   APPearance CLEAR Clear;Turbid;Slightly Cloudy;Cloudy   Specific Gravity, Urine 1.010 1.000 - 1.030   pH 6.0 5.0 - 8.0   Total Protein, Urine TRACE (A) Negative   Urine Glucose NEGATIVE Negative   Ketones, ur NEGATIVE Negative   Bilirubin Urine NEGATIVE Negative   Hgb urine dipstick TRACE-INTACT (A) Negative   Urobilinogen, UA 0.2 0.0 - 1.0   Leukocytes,Ua NEGATIVE Negative   Nitrite NEGATIVE Negative   WBC, UA 0-2/hpf 0-2/hpf   RBC / HPF 3-6/hpf (A) 0-2/hpf   Squamous Epithelial / HPF Rare(0-4/hpf) Rare(0-4/hpf)      The ASCVD Risk score (Arnett DK, et al., 2019) failed to calculate for the following reasons:   The 2019 ASCVD risk score is only valid for ages 59 to 6    Assessment & Plan:   Other fatigue -     CBC with Differential/Platelet -     Comprehensive metabolic panel with GFR -     Urinalysis, Routine w reflex microscopic  DOE (dyspnea on exertion) -     EKG 12-Lead -     Brain natriuretic peptide  SOB (shortness of breath) -     EKG 12-Lead -     Brain natriuretic peptide  Constipation, unspecified constipation type -     Polyethylene Glycol 3350; Take 17 g by mouth 2 (two) times daily as needed.  Dispense: 3350 g; Refill:  1  Hypokalemia -     Potassium Chloride Crys ER; Take 1 tablet (20 mEq total) by mouth daily.  Dispense: 30 tablet; Refill: 3    Return in about 1 week (around 09/16/2023), or if symptoms worsen or fail to improve.  EKG today confirms atrial fibrillation.  MiraLAX twice daily for relief of constipation.  Information given about constipation.  Tonna Frederic, MD    6/3 addendum: Lab work revealed elevated white count and LFTs and hypokalemia.  Called patient's daughter and advised her to take him to the emergency room for an urgent evaluation.

## 2023-09-09 NOTE — Addendum Note (Signed)
 Addended by: Delene Feinstein on: 09/09/2023 04:56 PM   Modules accepted: Orders

## 2023-09-09 NOTE — H&P (Signed)
 History and Physical    Philip Donaldson MWU:[id number] DOB: 1928/05/22 DOA: 09/09/2023  Patient coming from: Home.  Chief Complaint: Abdominal pain.  HPI: Philip Donaldson is a 89 y.o. male with history of paroxysmal atrial fibrillation, hypertension, hyperlipidemia has been experiencing abdominal pain in the right upper quadrant off-and-on for almost a month now.  Patient states his pain is mostly dull aching sometimes increased on eating.  It actually improved but started worsening last few days.  Had gone to his PCP and was referred to the ER due to abnormal labs.  Patient's last dose of Eliquis  was this morning.  ED Course: In the ER CT abdomen pelvis shows features concerning for acute cholecystitis.  General surgery was consulted.  Patient admitted for further management of acute cholecystitis.  Labs showed AST 79 ALT 64 lipase 49.  WBC 12.6.  Review of Systems: As per HPI, rest all negative.   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   Cancer (HCC)    skin Ca-melanoma- scalp   Chronic kidney disease, stage 3 unspecified (HCC) 05/29/2022   Diverticulitis    Elevated LDL cholesterol level 04/06/2021   Essential hypertension 01/31/2020   Gout 02/02/2012   History of gout 01/31/2020   Hypercholesterolemia 05/29/2022   Hyperlipidemia    Hypertension    Mass of arm, left 01/31/2020   New onset atrial fibrillation (HCC) 06/04/2022   Pain of right hand 04/29/2022   Tubular adenoma of colon 05/10/2011    Past Surgical History:  Procedure Laterality Date   COLONOSCOPY     POLYPECTOMY     SKIN CANCER EXCISION     to scalp     reports that he has quit smoking. He has never used smokeless tobacco. He reports that he does not drink alcohol and does not use drugs.  Allergies  Allergen Reactions   Clarithromycin     REACTION: dizziness   Indomethacin     Other Reaction(s): Unknown   Pneumococcal Vaccines     Local swelling at injection site that resolved  after a few days.  Likely Arthus reaction   Tuberculin Tests     Pt unsure- just told not to take it again    Family History  Problem Relation Age of Onset   Colon cancer Neg Hx    Esophageal cancer Neg Hx    Rectal cancer Neg Hx    Stomach cancer Neg Hx     Prior to Admission medications   Medication Sig Start Date End Date Taking? Authorizing Provider  amLODipine  (NORVASC ) 10 MG tablet Take 1 tablet (10 mg total) by mouth daily. 04/24/23   Tonna Frederic, MD  apixaban  (ELIQUIS ) 5 MG TABS tablet Take 5 mg by mouth 2 (two) times daily.    [provider]  hydrochlorothiazide  (HYDRODIURIL ) 25 MG tablet Take 1 tablet (25 mg total) by mouth daily. 04/24/23   Tonna Frederic, MD  lisinopril  (ZESTRIL ) 40 MG tablet Take 1 tablet (40 mg total) by mouth daily. 04/24/23   Tonna Frederic, MD  lovastatin  (MEVACOR ) 40 MG tablet TAKE 1 TABLET AT BEDTIME 05/19/23   Tonna Frederic, MD  metoprolol  tartrate (LOPRESSOR ) 100 MG tablet Take 0.5 tablets (50 mg total) by mouth 2 (two) times daily. 04/24/23   Tonna Frederic, MD  polyethylene glycol powder [facility]) 17 GM/SCOOP powder Take 17 g by mouth 2 (two) times daily as needed. 09/09/23   Tonna Frederic, MD  potassium chloride SA (  KLOR-CON M) 20 MEQ tablet Take 1 tablet (20 mEq total) by mouth daily. 09/09/23   Tonna Frederic, MD  timolol (TIMOPTIC) 0.5 % ophthalmic solution Place 1 drop into both eyes daily. 06/06/23   [provider]    Physical Exam: Constitutional: Moderately built and nourished. Vitals:   09/09/23 1812 09/09/23 2141  BP: (!) 153/79 (!) 152/91  Pulse: 84 87  Resp: 18 18  Temp: 98.3 F (36.8 C) 98.7 F (37.1 C)  TempSrc: Oral Oral  SpO2: 97% 97%  Weight: 87.1 kg   Height: 5\' 6"  (1.676 m)    Eyes: Anicteric no pallor. ENMT: No discharge from the ears eyes nose normal. Neck: No mass felt.  No neck rigidity. Respiratory: No rhonchi or  crepitations. Cardiovascular: S1-S2 heard. Abdomen: Right upper quadrant tenderness. Musculoskeletal: No edema. Skin: No rash. Neurologic: Alert awake oriented to time place and person.  Moves all extremities. Psychiatric: Appears normal.  Normal affect.   Labs on Admission: I have personally reviewed following labs and imaging studies  CBC: Recent Labs  Lab 09/09/23 1425  WBC 12.6*  NEUTROABS 9.4*  HGB 14.2  HCT 42.4  MCV 92.4  PLT 251.0   Basic Metabolic Panel: Recent Labs  Lab 09/09/23 1425  NA 135  K 2.9*  CL 95*  CO2 32  GLUCOSE 121*  BUN 18  CREATININE 1.11  CALCIUM 9.1   GFR: Estimated Creatinine Clearance: 43 mL/min (by C-G formula based on SCr of 1.11 mg/dL). Liver Function Tests: Recent Labs  Lab 09/09/23 1425  AST 79*  ALT 64*  ALKPHOS 141*  BILITOT 1.0  PROT 6.2  ALBUMIN 3.4*   Recent Labs  Lab 09/09/23 1837  LIPASE 49   No results for input(s): "AMMONIA" in the last 168 hours. Coagulation Profile: No results for input(s): "INR", "PROTIME" in the last 168 hours. Cardiac Enzymes: No results for input(s): "CKTOTAL", "CKMB", "CKMBINDEX", "TROPONINI" in the last 168 hours. BNP (last 3 results) Recent Labs    09/09/23 1425  PROBNP 136.0*   HbA1C: No results for input(s): "HGBA1C" in the last 72 hours. CBG: No results for input(s): "GLUCAP" in the last 168 hours. Lipid Profile: No results for input(s): "CHOL", "HDL", "LDLCALC", "TRIG", "CHOLHDL", "LDLDIRECT" in the last 72 hours. Thyroid  Function Tests: No results for input(s): "TSH", "T4TOTAL", "FREET4", "T3FREE", "THYROIDAB" in the last 72 hours. Anemia Panel: No results for input(s): "VITAMINB12", "FOLATE", "FERRITIN", "TIBC", "IRON", "RETICCTPCT" in the last 72 hours. Urine analysis:    Component Value Date/Time   COLORURINE YELLOW 09/09/2023 1433   APPEARANCEUR CLEAR 09/09/2023 1433   LABSPEC 1.010 09/09/2023 1433   PHURINE 6.0 09/09/2023 1433   GLUCOSEU NEGATIVE 09/09/2023  1433   HGBUR TRACE-INTACT (A) 09/09/2023 1433   BILIRUBINUR NEGATIVE 09/09/2023 1433   KETONESUR NEGATIVE 09/09/2023 1433   PROTEINUR NEGATIVE 10/17/2009 0715   UROBILINOGEN 0.2 09/09/2023 1433   NITRITE NEGATIVE 09/09/2023 1433   LEUKOCYTESUR NEGATIVE 09/09/2023 1433   Sepsis Labs: @LABRCNTIP (procalcitonin:4,lacticidven:4) )No results found for this or any previous visit (from the past 240 hours).   Radiological Exams on Admission: CT ABDOMEN PELVIS W CONTRAST Result Date: 09/09/2023 CLINICAL DATA:  89 year old with abdominal pain.  Fatigue. EXAM: CT ABDOMEN AND PELVIS WITH CONTRAST TECHNIQUE: Multidetector CT imaging of the abdomen and pelvis was performed using the standard protocol following bolus administration of intravenous contrast. RADIATION DOSE REDUCTION: This exam was performed according to the departmental dose-optimization program which includes automated exposure control, adjustment of the mA and/or kV according  to patient size and/or use of iterative reconstruction technique. CONTRAST:  100mL OMNIPAQUE IOHEXOL 300 MG/ML  SOLN COMPARISON:  None Available. FINDINGS: Lower chest: 6 mm right lower lobe pulmonary nodule, series 5, image 12. Hepatobiliary: No focal hepatic abnormality. The gallbladder is moderately distended. There are multiple calcified gallstones as well as a stone in the gallbladder neck. Moderate gallbladder wall thickening and pericholecystic inflammation. Trace pericholecystic fluid. The common bile duct is normal in caliber. Trace central intrahepatic biliary ductal dilatation. Pancreas: No ductal dilatation or inflammation. Spleen: Normal in size without focal abnormality. Splenule inferiorly. Adrenals/Urinary Tract: No adrenal nodule. Bilateral water density lesions in the kidneys as well as low-density lesions too small to characterize. No further follow-up imaging is recommended. No hydronephrosis or renal calculi. Partially distended urinary bladder. No bladder  wall thickening. Stomach/Bowel: Small hiatal hernia. No bowel obstruction or inflammatory change. Left colonic diverticulosis. No diverticulitis. Small volume of stool in the colon. Normal appendix. Vascular/Lymphatic: Aortic atherosclerosis. No aortic aneurysm. Moderate branch atherosclerosis few prominent periportal nodes measuring up to 8 mm, likely reactive in the setting. No suspicious lymphadenopathy. Reproductive: Enlarged prostate gland spanning 6 cm transverse causing mass effect on the bladder base. Other: Small fat containing umbilical hernia.  No free air. Musculoskeletal: Multilevel degenerative change in the spine. There are no acute or suspicious osseous abnormalities. IMPRESSION: 1. Findings consistent with acute cholecystitis. Multiple calcified gallstones, including a stone in the gallbladder neck. Moderate gallbladder wall thickening and pericholecystic inflammation. Trace pericholecystic fluid. 2. Left colonic diverticulosis without diverticulitis. 3. Enlarged prostate gland causing mass effect on the bladder base. 4. Small hiatal hernia. 5. A 6 mm right lower lobe pulmonary nodule. Per Fleischner Society Guidelines, recommend a non-contrast Chest CT at 6-12 months. If patient is high risk for malignancy, consider an additional non-contrast Chest CT at 18-24 months. If patient is low risk for malignancy, non-contrast Chest CT at 18-24 months is optional. These guidelines do not apply to immunocompromised patients and patients with cancer. Follow up in patients with significant comorbidities as clinically warranted. For lung cancer screening, adhere to Lung-RADS guidelines. Reference: Radiology. 2017; 284(1):228-43. Aortic Atherosclerosis (ICD10-I70.0). Electronically Signed   By: Chadwick Colonel M.D.   On: 09/09/2023 20:05    EKG: Independently reviewed.  A-fib rate controlled.  Assessment/Plan Principal Problem:   Acute cholecystitis Active Problems:   Essential hypertension    Hyperlipidemia   Hypertension   Chronic kidney disease, stage 3 unspecified (HCC)   Persistent atrial fibrillation (HCC)   Elevated LFTs   Hypokalemia    Acute cholecystitis -    appreciate general surgery consult.  Patient has been placed on IV antibiotics and at this time clear liquid diet General Surgery planning surgery in 48 hours. Severe hypokalemia cause not clear.  Replace and recheck.  Check magnesium levels. History of A-fib presently rate controlled.  Holding Eliquis  in anticipation of surgery and on heparin bridging.  On metoprolol  for rate control. Hypertension continue amlodipine  and lisinopril  and metoprolol .  Hold hydrochlorothiazide  for now.  Follow blood pressure trends. Hyperlipidemia hold statins for now since LFTs are elevated. Elevated LFTs may be from #1.  Follow LFTs.  Since patient has acute cholecystitis will need close monitoring further workup and more than 2 midnight stay.   DVT prophylaxis: Heparin infusion. Code Status: Full code. Family Communication: Patient's daughter. Disposition Plan: Medical floor. Consults called: General Surgery. Admission status: Observation

## 2023-09-09 NOTE — ED Provider Notes (Signed)
 [place] EMERGENCY DEPARTMENT AT [facility] Provider Note   CSN: [id number] Arrival date & time: 09/09/23  1757     History  Chief Complaint  Patient presents with   Fatigue   Abnormal Lab    Philip Donaldson is a 89 y.o. male.  The history is provided by the patient and medical records.  Abnormal Lab  89 year old male with history of chronic kidney disease stage III, hypertension, hyperlipidemia, A-fib on Eliquis , presenting to the ED from PCP office for abnormal labs.  Patient reports he has been feeling generally unwell for about 1 week now, got acutely worse on Sunday.  States he is still eating and drinking but less than normal.  He has not had any fever or chills.  No vomiting or diarrhea.  Today started having some abdominal discomfort so was seen by PCP and found to have hypokalemia and elevated LFTs.  He was sent here for further evaluation.  Home Medications Prior to Admission medications   Medication Sig Start Date End Date Taking? Authorizing Provider  amLODipine  (NORVASC ) 10 MG tablet Take 1 tablet (10 mg total) by mouth daily. 04/24/23   Tonna Frederic, MD  apixaban  (ELIQUIS ) 5 MG TABS tablet Take 5 mg by mouth 2 (two) times daily.    [provider]  hydrochlorothiazide  (HYDRODIURIL ) 25 MG tablet Take 1 tablet (25 mg total) by mouth daily. 04/24/23   Tonna Frederic, MD  lisinopril  (ZESTRIL ) 40 MG tablet Take 1 tablet (40 mg total) by mouth daily. 04/24/23   Tonna Frederic, MD  lovastatin  (MEVACOR ) 40 MG tablet TAKE 1 TABLET AT BEDTIME 05/19/23   Tonna Frederic, MD  metoprolol  tartrate (LOPRESSOR ) 100 MG tablet Take 0.5 tablets (50 mg total) by mouth 2 (two) times daily. 04/24/23   Tonna Frederic, MD  polyethylene glycol powder [facility]) 17 GM/SCOOP powder Take 17 g by mouth 2 (two) times daily as needed. 09/09/23   Tonna Frederic, MD  potassium chloride SA (KLOR-CON M) 20 MEQ tablet Take 1 tablet (20  mEq total) by mouth daily. 09/09/23   Tonna Frederic, MD  timolol (TIMOPTIC) 0.5 % ophthalmic solution Place 1 drop into both eyes daily. 06/06/23   [provider]      Allergies    Clarithromycin, Indomethacin, Pneumococcal vaccines, and Tuberculin tests    Review of Systems   Review of Systems  Constitutional:  Positive for fatigue.  Gastrointestinal:  Positive for abdominal pain.  All other systems reviewed and are negative.   Physical Exam Updated Vital Signs BP (!) 152/91 (BP Location: Right Arm)   Pulse 87   Temp 98.7 F (37.1 C) (Oral)   Resp 18   Ht 5\' 6"  (1.676 m)   Wt 87.1 kg   SpO2 97%   BMI 30.99 kg/m   Physical Exam Vitals and nursing note reviewed.  Constitutional:      Appearance: He is well-developed.  HENT:     Head: Normocephalic and atraumatic.  Eyes:     Conjunctiva/sclera: Conjunctivae normal.     Pupils: Pupils are equal, round, and reactive to light.  Cardiovascular:     Rate and Rhythm: Normal rate and regular rhythm.     Heart sounds: Normal heart sounds.  Pulmonary:     Effort: Pulmonary effort is normal.     Breath sounds: Normal breath sounds.  Abdominal:     General: Bowel sounds are normal.     Palpations: Abdomen is soft.  Tenderness: There is abdominal tenderness.     Comments: Protuberant abdomen, umbilical hernia which is easily reducible, mildly tender epigastric/RUQ without guarding  Musculoskeletal:        General: Normal range of motion.     Cervical back: Normal range of motion.  Skin:    General: Skin is warm and dry.  Neurological:     Mental Status: He is alert and oriented to person, place, and time.     ED Results / Procedures / Treatments   Labs (all labs ordered are listed, but only abnormal results are displayed) Labs Reviewed  LIPASE, BLOOD    EKG None  Radiology CT ABDOMEN PELVIS W CONTRAST Result Date: 09/09/2023 CLINICAL DATA:  89 year old with abdominal pain.  Fatigue. EXAM: CT  ABDOMEN AND PELVIS WITH CONTRAST TECHNIQUE: Multidetector CT imaging of the abdomen and pelvis was performed using the standard protocol following bolus administration of intravenous contrast. RADIATION DOSE REDUCTION: This exam was performed according to the departmental dose-optimization program which includes automated exposure control, adjustment of the mA and/or kV according to patient size and/or use of iterative reconstruction technique. CONTRAST:  100mL OMNIPAQUE IOHEXOL 300 MG/ML  SOLN COMPARISON:  None Available. FINDINGS: Lower chest: 6 mm right lower lobe pulmonary nodule, series 5, image 12. Hepatobiliary: No focal hepatic abnormality. The gallbladder is moderately distended. There are multiple calcified gallstones as well as a stone in the gallbladder neck. Moderate gallbladder wall thickening and pericholecystic inflammation. Trace pericholecystic fluid. The common bile duct is normal in caliber. Trace central intrahepatic biliary ductal dilatation. Pancreas: No ductal dilatation or inflammation. Spleen: Normal in size without focal abnormality. Splenule inferiorly. Adrenals/Urinary Tract: No adrenal nodule. Bilateral water density lesions in the kidneys as well as low-density lesions too small to characterize. No further follow-up imaging is recommended. No hydronephrosis or renal calculi. Partially distended urinary bladder. No bladder wall thickening. Stomach/Bowel: Small hiatal hernia. No bowel obstruction or inflammatory change. Left colonic diverticulosis. No diverticulitis. Small volume of stool in the colon. Normal appendix. Vascular/Lymphatic: Aortic atherosclerosis. No aortic aneurysm. Moderate branch atherosclerosis few prominent periportal nodes measuring up to 8 mm, likely reactive in the setting. No suspicious lymphadenopathy. Reproductive: Enlarged prostate gland spanning 6 cm transverse causing mass effect on the bladder base. Other: Small fat containing umbilical hernia.  No free air.  Musculoskeletal: Multilevel degenerative change in the spine. There are no acute or suspicious osseous abnormalities. IMPRESSION: 1. Findings consistent with acute cholecystitis. Multiple calcified gallstones, including a stone in the gallbladder neck. Moderate gallbladder wall thickening and pericholecystic inflammation. Trace pericholecystic fluid. 2. Left colonic diverticulosis without diverticulitis. 3. Enlarged prostate gland causing mass effect on the bladder base. 4. Small hiatal hernia. 5. A 6 mm right lower lobe pulmonary nodule. Per Fleischner Society Guidelines, recommend a non-contrast Chest CT at 6-12 months. If patient is high risk for malignancy, consider an additional non-contrast Chest CT at 18-24 months. If patient is low risk for malignancy, non-contrast Chest CT at 18-24 months is optional. These guidelines do not apply to immunocompromised patients and patients with cancer. Follow up in patients with significant comorbidities as clinically warranted. For lung cancer screening, adhere to Lung-RADS guidelines. Reference: Radiology. 2017; 284(1):228-43. Aortic Atherosclerosis (ICD10-I70.0). Electronically Signed   By: Chadwick Colonel M.D.   On: 09/09/2023 20:05    Procedures Procedures    Medications Ordered in ED Medications  piperacillin-tazobactam (ZOSYN) IVPB 3.375 g (3.375 g Intravenous New Bag/Given 09/09/23 2319)  iohexol (OMNIPAQUE) 300 MG/ML solution 100 mL (100 mLs  Intravenous Contrast Given 09/09/23 1935)  potassium chloride SA (KLOR-CON M) CR tablet 40 mEq (40 mEq Oral Given 09/09/23 2320)    ED Course/ Medical Decision Making/ A&P                                 Medical Decision Making Amount and/or Complexity of Data Reviewed Labs: ordered. Radiology: ordered and independent interpretation performed.  Risk Prescription drug management. Decision regarding hospitalization.   89 y.o. M her with fatigue and abnormal labs from PCP office.  Has felt poorly for about 1  week, worse since Sunday.    Afebrile, non-toxic in appearance here.  He does have some minor abdominal tenderness epigastric and right upper quadrant.  Has umbilical hernia which is soft and easily reducible.  Labs from earlier with K+ of 2.9.  AST 79, ALT 64, alk phos 141.  Lipase is normal.  CT with findings of cholecystitis-- calcified stones, one lodged in neck.  No findings of choledocholithiasis.  He is given dose of IV Zosyn along with potassium supplementation.  Spoke with Dr. Afton Horse-- surgical team will evaluate in the AM. Agrees with medical admission given advanced age.  Aware he is on eliquis  for AFIB.  Discussed with hospitalist, Dr. [facility]-- will admit for ongoing care.  Final Clinical Impression(s) / ED Diagnoses Final diagnoses:  Cholecystitis  Hypokalemia    Rx / DC Orders ED Discharge Orders     None         Coretha Dew, PA-C 09/09/23 2349    Dorenda Gandy, MD 09/10/23 [phone]

## 2023-09-09 NOTE — Consult Note (Signed)
 Reason for Consult: Acute cholecystitis Referring Physician: Liam Redhead MD  Philip Donaldson is an 89 y.o. male.  HPI: Patient is a 89 year old male with a 1 week history of abdominal pain.  He complains of pain in his right upper abdomen.  He was evaluated by the emergency department by CT scan which showed gallstones and a thickened gallbladder wall consistent with acute cholecystitis.  The pain is sharp in nature location right upper quadrant without radiation.  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   Cancer (HCC)    skin Ca-melanoma- scalp   Chronic kidney disease, stage 3 unspecified (HCC) 05/29/2022   Diverticulitis    Elevated LDL cholesterol level 04/06/2021   Essential hypertension 01/31/2020   Gout 02/02/2012   History of gout 01/31/2020   Hypercholesterolemia 05/29/2022   Hyperlipidemia    Hypertension    Mass of arm, left 01/31/2020   New onset atrial fibrillation (HCC) 06/04/2022   Pain of right hand 04/29/2022   Tubular adenoma of colon 05/10/2011    Past Surgical History:  Procedure Laterality Date   COLONOSCOPY     POLYPECTOMY     SKIN CANCER EXCISION     to scalp    Family History  Problem Relation Age of Onset   Colon cancer Neg Hx    Esophageal cancer Neg Hx    Rectal cancer Neg Hx    Stomach cancer Neg Hx     Social History:  reports that he has quit smoking. He has never used smokeless tobacco. He reports that he does not drink alcohol and does not use drugs.  Allergies:  Allergies  Allergen Reactions   Clarithromycin     REACTION: dizziness   Indomethacin     Other Reaction(s): Unknown   Pneumococcal Vaccines     Local swelling at injection site that resolved after a few days.  Likely Arthus reaction   Tuberculin Tests     Pt unsure- just told not to take it again    Medications: I have reviewed the patient's current medications.  Results for orders placed or performed during the hospital encounter of 09/09/23 (from  the past 48 hours)  Lipase, blood     Status: None   Collection Time: 09/09/23  6:37 PM  Result Value Ref Range   Lipase 49 11 - 51 U/L    Comment: Performed at [facility], [address] [place], [place] [address]    CT ABDOMEN PELVIS W CONTRAST Result Date: 09/09/2023 CLINICAL DATA:  89 year old with abdominal pain.  Fatigue. EXAM: CT ABDOMEN AND PELVIS WITH CONTRAST TECHNIQUE: Multidetector CT imaging of the abdomen and pelvis was performed using the standard protocol following bolus administration of intravenous contrast. RADIATION DOSE REDUCTION: This exam was performed according to the departmental dose-optimization program which includes automated exposure control, adjustment of the mA and/or kV according to patient size and/or use of iterative reconstruction technique. CONTRAST:  100mL OMNIPAQUE IOHEXOL 300 MG/ML  SOLN COMPARISON:  None Available. FINDINGS: Lower chest: 6 mm right lower lobe pulmonary nodule, series 5, image 12. Hepatobiliary: No focal hepatic abnormality. The gallbladder is moderately distended. There are multiple calcified gallstones as well as a stone in the gallbladder neck. Moderate gallbladder wall thickening and pericholecystic inflammation. Trace pericholecystic fluid. The common bile duct is normal in caliber. Trace central intrahepatic biliary ductal dilatation. Pancreas: No ductal dilatation or inflammation. Spleen: Normal in size without focal abnormality. Splenule inferiorly. Adrenals/Urinary Tract: No adrenal nodule. Bilateral water  density lesions in the kidneys as well as low-density lesions too small to characterize. No further follow-up imaging is recommended. No hydronephrosis or renal calculi. Partially distended urinary bladder. No bladder wall thickening. Stomach/Bowel: Small hiatal hernia. No bowel obstruction or inflammatory change. Left colonic diverticulosis. No diverticulitis. Small volume of stool in the colon. Normal appendix.  Vascular/Lymphatic: Aortic atherosclerosis. No aortic aneurysm. Moderate branch atherosclerosis few prominent periportal nodes measuring up to 8 mm, likely reactive in the setting. No suspicious lymphadenopathy. Reproductive: Enlarged prostate gland spanning 6 cm transverse causing mass effect on the bladder base. Other: Small fat containing umbilical hernia.  No free air. Musculoskeletal: Multilevel degenerative change in the spine. There are no acute or suspicious osseous abnormalities. IMPRESSION: 1. Findings consistent with acute cholecystitis. Multiple calcified gallstones, including a stone in the gallbladder neck. Moderate gallbladder wall thickening and pericholecystic inflammation. Trace pericholecystic fluid. 2. Left colonic diverticulosis without diverticulitis. 3. Enlarged prostate gland causing mass effect on the bladder base. 4. Small hiatal hernia. 5. A 6 mm right lower lobe pulmonary nodule. Per Fleischner Society Guidelines, recommend a non-contrast Chest CT at 6-12 months. If patient is high risk for malignancy, consider an additional non-contrast Chest CT at 18-24 months. If patient is low risk for malignancy, non-contrast Chest CT at 18-24 months is optional. These guidelines do not apply to immunocompromised patients and patients with cancer. Follow up in patients with significant comorbidities as clinically warranted. For lung cancer screening, adhere to Lung-RADS guidelines. Reference: Radiology. 2017; 284(1):228-43. Aortic Atherosclerosis (ICD10-I70.0). Electronically Signed   By: Chadwick Colonel M.D.   On: 09/09/2023 20:05    Review of Systems  Constitutional: Negative.   HENT: Negative.    Respiratory:  Negative for cough and shortness of breath.   Cardiovascular:  Positive for palpitations.  Gastrointestinal:  Positive for abdominal pain.  Musculoskeletal: Negative.   Neurological: Negative.   Hematological:  Bruises/bleeds easily.  Psychiatric/Behavioral: Negative.      Blood pressure (!) 152/91, pulse 87, temperature 98.7 F (37.1 C), temperature source Oral, resp. rate 18, height 5\' 6"  (1.676 m), weight 87.1 kg, SpO2 97%. Physical Exam Vitals reviewed.  Cardiovascular:     Rate and Rhythm: Rhythm irregular.  Pulmonary:     Effort: Pulmonary effort is normal.  Abdominal:     Tenderness: There is abdominal tenderness in the right upper quadrant.     Hernia: A hernia is present. Hernia is present in the umbilical area.  Skin:    General: Skin is warm.  Neurological:     General: No focal deficit present.     Mental Status: He is alert.  Psychiatric:        Mood and Affect: Mood normal.     Assessment/Plan: Acute cholecystitis-agree with medicine admission due to A-fib and Eliquis .  Stop Eliquis  and can use heparin drip if need be  May have clear liquids  IV antibiotics for now and plan on surgery  in 48 hours since he is of relatively good health for his advanced age. ?  Cardiac clearance? Brandy Cal Darren Nodal MD 09/09/2023, 11:09 PM    High complexity

## 2023-09-09 NOTE — ED Provider Triage Note (Signed)
 Emergency Medicine Provider Triage Evaluation Note  Philip Donaldson , a 89 y.o. male  was evaluated in triage.  Pt complains of abdominal pain.  Review of Systems  Positive: Ongoing fatigue, pain and bloating x 2 days. Negative: Fever, vomiting  Physical Exam  BP (!) 153/79 (BP Location: Right Arm)   Pulse 84   Temp 98.3 F (36.8 C) (Oral)   Resp 18   Ht 5\' 6"  (1.676 m)   Wt 87.1 kg   SpO2 97%   BMI 30.99 kg/m  Gen:   Awake, no distress   Resp:  Normal effort  MSK:   Moves extremities without difficulty  Other:    Medical Decision Making  Medically screening exam initiated at 6:36 PM.  Appropriate orders placed.  [name] was informed that the remainder of the evaluation will be completed by another provider, this initial triage assessment does not replace that evaluation, and the importance of remaining in the ED until their evaluation is complete.  Here as the advice of PCP where blood tests were done earlier today in evaluation of abdominal pain and distention earlier today (*labs available in [facility]). Mild leukocytosis, mild LFT elevation. Exam shows abdomen that is distended, tense without peritonitis.    Mandy Second, PA-C 09/09/23 1839

## 2023-09-09 NOTE — ED Triage Notes (Addendum)
 Pt c/o fatigue x "a while, but worse since Sunday."  Pt was seen by PCP earlier for same and had labs drawn.  Labs resulted elevated LFTs, WBC, and BNP and hypokalemia. Denies pain and SOB.   Lab work and EKG completed at PCP office today and in chart.

## 2023-09-10 ENCOUNTER — Other Ambulatory Visit: Payer: Self-pay

## 2023-09-10 ENCOUNTER — Encounter (HOSPITAL_COMMUNITY): Payer: Self-pay | Admitting: Internal Medicine

## 2023-09-10 DIAGNOSIS — Z888 Allergy status to other drugs, medicaments and biological substances status: Secondary | ICD-10-CM | POA: Diagnosis not present

## 2023-09-10 DIAGNOSIS — Z85828 Personal history of other malignant neoplasm of skin: Secondary | ICD-10-CM | POA: Diagnosis not present

## 2023-09-10 DIAGNOSIS — Z79899 Other long term (current) drug therapy: Secondary | ICD-10-CM | POA: Diagnosis not present

## 2023-09-10 DIAGNOSIS — K810 Acute cholecystitis: Secondary | ICD-10-CM | POA: Diagnosis present

## 2023-09-10 DIAGNOSIS — Z860101 Personal history of adenomatous and serrated colon polyps: Secondary | ICD-10-CM | POA: Diagnosis not present

## 2023-09-10 DIAGNOSIS — E7800 Pure hypercholesterolemia, unspecified: Secondary | ICD-10-CM | POA: Diagnosis present

## 2023-09-10 DIAGNOSIS — R5383 Other fatigue: Secondary | ICD-10-CM | POA: Diagnosis present

## 2023-09-10 DIAGNOSIS — Z887 Allergy status to serum and vaccine status: Secondary | ICD-10-CM | POA: Diagnosis not present

## 2023-09-10 DIAGNOSIS — N1831 Chronic kidney disease, stage 3a: Secondary | ICD-10-CM | POA: Diagnosis present

## 2023-09-10 DIAGNOSIS — I480 Paroxysmal atrial fibrillation: Secondary | ICD-10-CM | POA: Diagnosis present

## 2023-09-10 DIAGNOSIS — R630 Anorexia: Secondary | ICD-10-CM | POA: Diagnosis present

## 2023-09-10 DIAGNOSIS — Z85820 Personal history of malignant melanoma of skin: Secondary | ICD-10-CM | POA: Diagnosis not present

## 2023-09-10 DIAGNOSIS — Z87891 Personal history of nicotine dependence: Secondary | ICD-10-CM | POA: Diagnosis not present

## 2023-09-10 DIAGNOSIS — N183 Chronic kidney disease, stage 3 unspecified: Secondary | ICD-10-CM | POA: Diagnosis not present

## 2023-09-10 DIAGNOSIS — Z881 Allergy status to other antibiotic agents status: Secondary | ICD-10-CM | POA: Diagnosis not present

## 2023-09-10 DIAGNOSIS — R7401 Elevation of levels of liver transaminase levels: Secondary | ICD-10-CM | POA: Diagnosis present

## 2023-09-10 DIAGNOSIS — E876 Hypokalemia: Secondary | ICD-10-CM | POA: Diagnosis present

## 2023-09-10 DIAGNOSIS — I4891 Unspecified atrial fibrillation: Secondary | ICD-10-CM | POA: Diagnosis not present

## 2023-09-10 DIAGNOSIS — I4819 Other persistent atrial fibrillation: Secondary | ICD-10-CM | POA: Diagnosis not present

## 2023-09-10 DIAGNOSIS — K8000 Calculus of gallbladder with acute cholecystitis without obstruction: Secondary | ICD-10-CM | POA: Diagnosis present

## 2023-09-10 DIAGNOSIS — Q441 Other congenital malformations of gallbladder: Secondary | ICD-10-CM | POA: Diagnosis not present

## 2023-09-10 DIAGNOSIS — Z7901 Long term (current) use of anticoagulants: Secondary | ICD-10-CM | POA: Diagnosis not present

## 2023-09-10 DIAGNOSIS — R0602 Shortness of breath: Secondary | ICD-10-CM | POA: Diagnosis present

## 2023-09-10 DIAGNOSIS — I129 Hypertensive chronic kidney disease with stage 1 through stage 4 chronic kidney disease, or unspecified chronic kidney disease: Secondary | ICD-10-CM | POA: Diagnosis present

## 2023-09-10 LAB — CBC WITH DIFFERENTIAL/PLATELET
Abs Immature Granulocytes: 0.14 10*3/uL — ABNORMAL HIGH (ref 0.00–0.07)
Basophils Absolute: 0.1 10*3/uL (ref 0.0–0.1)
Basophils Relative: 1 %
Eosinophils Absolute: 0.1 10*3/uL (ref 0.0–0.5)
Eosinophils Relative: 1 %
HCT: 39.9 % (ref 39.0–52.0)
Hemoglobin: 12.8 g/dL — ABNORMAL LOW (ref 13.0–17.0)
Immature Granulocytes: 1 %
Lymphocytes Relative: 10 %
Lymphs Abs: 1.3 10*3/uL (ref 0.7–4.0)
MCH: 31.3 pg (ref 26.0–34.0)
MCHC: 32.1 g/dL (ref 30.0–36.0)
MCV: 97.6 fL (ref 80.0–100.0)
Monocytes Absolute: 1.5 10*3/uL — ABNORMAL HIGH (ref 0.1–1.0)
Monocytes Relative: 11 %
Neutro Abs: 9.8 10*3/uL — ABNORMAL HIGH (ref 1.7–7.7)
Neutrophils Relative %: 76 %
Platelets: 217 10*3/uL (ref 150–400)
RBC: 4.09 MIL/uL — ABNORMAL LOW (ref 4.22–5.81)
RDW: 13.3 % (ref 11.5–15.5)
WBC: 12.9 10*3/uL — ABNORMAL HIGH (ref 4.0–10.5)
nRBC: 0 % (ref 0.0–0.2)

## 2023-09-10 LAB — HEPATIC FUNCTION PANEL
ALT: 46 U/L — ABNORMAL HIGH (ref 0–44)
AST: 49 U/L — ABNORMAL HIGH (ref 15–41)
Albumin: 2.4 g/dL — ABNORMAL LOW (ref 3.5–5.0)
Alkaline Phosphatase: 82 U/L (ref 38–126)
Bilirubin, Direct: 0.3 mg/dL — ABNORMAL HIGH (ref 0.0–0.2)
Indirect Bilirubin: 0.9 mg/dL (ref 0.3–0.9)
Total Bilirubin: 1.2 mg/dL (ref 0.0–1.2)
Total Protein: 5.2 g/dL — ABNORMAL LOW (ref 6.5–8.1)

## 2023-09-10 LAB — BASIC METABOLIC PANEL WITH GFR
Anion gap: 9 (ref 5–15)
BUN: 14 mg/dL (ref 8–23)
CO2: 25 mmol/L (ref 22–32)
Calcium: 8.4 mg/dL — ABNORMAL LOW (ref 8.9–10.3)
Chloride: 98 mmol/L (ref 98–111)
Creatinine, Ser: 0.55 mg/dL — ABNORMAL LOW (ref 0.61–1.24)
GFR, Estimated: >60 mL/min (ref >60)
Glucose, Bld: 109 mg/dL — ABNORMAL HIGH (ref 70–99)
Potassium: 4.6 mmol/L (ref 3.5–5.1)
Sodium: 132 mmol/L — ABNORMAL LOW (ref 135–145)

## 2023-09-10 LAB — MAGNESIUM: Magnesium: 1.7 mg/dL (ref 1.7–2.4)

## 2023-09-10 LAB — HEPARIN LEVEL (UNFRACTIONATED): Heparin Unfractionated: 1 [IU]/mL — ABNORMAL HIGH (ref 0.30–0.70)

## 2023-09-10 LAB — APTT: aPTT: 58 s — ABNORMAL HIGH (ref 24–36)

## 2023-09-10 MED ORDER — HEPARIN (PORCINE) 25000 UT/250ML-% IV SOLN
1600.0000 [IU]/h | INTRAVENOUS | Status: AC
  Administered 2023-09-10: 1300 [IU]/h via INTRAVENOUS
  Administered 2023-09-10: 1150 [IU]/h via INTRAVENOUS
  Filled 2023-09-10 (×2): qty 250

## 2023-09-10 MED ORDER — HEPARIN BOLUS VIA INFUSION
4000.0000 [IU] | Freq: Once | INTRAVENOUS | Status: AC
  Administered 2023-09-10: 4000 [IU] via INTRAVENOUS
  Filled 2023-09-10: qty 4000

## 2023-09-10 MED ORDER — TIMOLOL MALEATE 0.5 % OP SOLN
1.0000 [drp] | Freq: Every day | OPHTHALMIC | Status: DC
  Administered 2023-09-10 – 2023-09-14 (×5): 1 [drp] via OPHTHALMIC
  Filled 2023-09-10: qty 5

## 2023-09-10 MED ORDER — LISINOPRIL 20 MG PO TABS
40.0000 mg | ORAL_TABLET | Freq: Every day | ORAL | Status: DC
  Administered 2023-09-10 – 2023-09-14 (×5): 40 mg via ORAL
  Filled 2023-09-10 (×5): qty 2

## 2023-09-10 MED ORDER — AMLODIPINE BESYLATE 10 MG PO TABS
10.0000 mg | ORAL_TABLET | Freq: Every day | ORAL | Status: DC
  Administered 2023-09-10 – 2023-09-14 (×5): 10 mg via ORAL
  Filled 2023-09-10 (×6): qty 1

## 2023-09-10 MED ORDER — POTASSIUM CHLORIDE 10 MEQ/100ML IV SOLN
10.0000 meq | INTRAVENOUS | Status: AC
  Administered 2023-09-10 (×4): 10 meq via INTRAVENOUS
  Filled 2023-09-10: qty 100

## 2023-09-10 MED ORDER — PIPERACILLIN-TAZOBACTAM 3.375 G IVPB
3.3750 g | Freq: Three times a day (TID) | INTRAVENOUS | Status: DC
  Administered 2023-09-10 – 2023-09-13 (×10): 3.375 g via INTRAVENOUS
  Filled 2023-09-10 (×10): qty 50

## 2023-09-10 MED ORDER — POTASSIUM CHLORIDE 10 MEQ/100ML IV SOLN
10.0000 meq | INTRAVENOUS | Status: AC
  Administered 2023-09-10: 10 meq via INTRAVENOUS

## 2023-09-10 MED ORDER — INDOCYANINE GREEN 25 MG IV SOLR
2.5000 mg | INTRAVENOUS | Status: AC
  Administered 2023-09-11: 2.5 mg via INTRAVENOUS

## 2023-09-10 MED ORDER — METOPROLOL TARTRATE 50 MG PO TABS
50.0000 mg | ORAL_TABLET | Freq: Two times a day (BID) | ORAL | Status: DC
  Administered 2023-09-10 – 2023-09-11 (×3): 50 mg via ORAL
  Filled 2023-09-10 (×4): qty 1

## 2023-09-10 MED ORDER — HEPARIN BOLUS VIA INFUSION
1000.0000 [IU] | Freq: Once | INTRAVENOUS | Status: AC
  Administered 2023-09-10: 1000 [IU] via INTRAVENOUS
  Filled 2023-09-10: qty 1000

## 2023-09-10 MED ORDER — FENTANYL CITRATE PF 50 MCG/ML IJ SOSY
25.0000 ug | PREFILLED_SYRINGE | INTRAMUSCULAR | Status: DC | PRN
  Administered 2023-09-10: 25 ug via INTRAVENOUS
  Filled 2023-09-10: qty 1

## 2023-09-10 MED ORDER — MORPHINE SULFATE (PF) 2 MG/ML IV SOLN
1.0000 mg | INTRAVENOUS | Status: DC | PRN
  Administered 2023-09-11: 1 mg via INTRAVENOUS
  Filled 2023-09-10: qty 1

## 2023-09-10 NOTE — Care Management Obs Status (Signed)
 MEDICARE OBSERVATION STATUS NOTIFICATION   Patient Details  Name: Philip Donaldson MRN: [id number] Date of Birth: 09-27-1928   Medicare Observation Status Notification Given:       Bari Leys, RN 09/10/2023, 11:29 AM

## 2023-09-10 NOTE — Plan of Care (Signed)

## 2023-09-10 NOTE — TOC Initial Note (Signed)
 Transition of Care [facility]) - Initial/Assessment Note    Patient Details  Name: Philip Donaldson MRN: [id number] Date of Birth: [birth year]  Transition of Care [facility]) CM/SW Contact:    Bari Leys, RN Phone Number: 09/10/2023, 10:39 AM  Clinical Narrative:  NCM called to [facility], [facility] confirmed patient resides in ILF, no FL2 need, patient can return at any time, no report needed.                        Patient Goals and CMS Choice            Expected Discharge Plan and Services                                              Prior Living Arrangements/Services                       Activities of Daily Living   ADL Screening (condition at time of admission) Independently performs ADLs?: Yes (appropriate for developmental age) Is the patient deaf or have difficulty hearing?: Yes Does the patient have difficulty seeing, even when wearing glasses/contacts?: No Does the patient have difficulty concentrating, remembering, or making decisions?: No  Permission Sought/Granted                  Emotional Assessment              Admission diagnosis:  Acute cholecystitis [K81.0] Hypokalemia [E87.6] Cholecystitis [K81.9] Patient Active Problem List   Diagnosis Date Noted   Other fatigue 09/09/2023   SOB (shortness of breath) 09/09/2023   Constipation 09/09/2023   Acute cholecystitis 09/09/2023   Elevated LFTs 09/09/2023   Hypokalemia 09/09/2023   Persistent atrial fibrillation (HCC) 06/04/2022   Cancer (HCC) 05/29/2022   Diverticulitis 05/29/2022   Hyperlipidemia 05/29/2022   Hypertension 05/29/2022   Chronic kidney disease, stage 3 unspecified (HCC) 05/29/2022   Hypercholesterolemia 05/29/2022   Pain of right hand 04/29/2022   Bradycardia 04/29/2022   Elevated LDL cholesterol level 04/06/2021   Mass of arm, left 01/31/2020   History of gout 01/31/2020   Essential hypertension 01/31/2020   Gout 02/02/2012   Tubular  adenoma of colon 05/10/2011   PCP:  Tonna Frederic, MD Pharmacy:   [facility] - [place], [place] - [address] [place] [address] Phone: [phone] Fax: [phone]  [id number] - [place], [place] - [address] AT [facility] [address] [place] [address] Phone: [phone] Fax: [phone]     Social Drivers of Health (SDOH) Social History: SDOH Screenings   Food Insecurity: No Food Insecurity (09/10/2023)  Housing: Low Risk  (09/10/2023)  Transportation Needs: No Transportation Needs (09/10/2023)  Utilities: Not At Risk (09/10/2023)  Alcohol Screen: Low Risk  (11/11/2022)  Depression (PHQ2-9): Low Risk  (07/24/2023)  Financial Resource Strain: Low Risk  (11/11/2022)  Physical Activity: Sufficiently Active (11/11/2022)  Social Connections: Moderately Isolated (09/10/2023)  Stress: No Stress Concern Present (11/11/2022)  Tobacco Use: Medium Risk (09/10/2023)  Health Literacy: Adequate Health Literacy (11/11/2022)   SDOH Interventions:     Readmission Risk Interventions     No data to display

## 2023-09-10 NOTE — TOC Initial Note (Signed)
 Transition of Care [facility]) - Initial/Assessment Note    Patient Details  Name: Philip Donaldson MRN: [id number] Date of Birth: 1929-02-13  Transition [facility]) CM/SW Contact:    Bari Leys, RN Phone Number: 09/10/2023, 11:47 AM  Clinical Narrative:   Met with patient and daughter , [name], at bedside to introduce role of TOC/NCM and review for dc planning, daughter confirmed patient resides at [facility] with plans to return at discharge. MD entered room for daily rounds, unable to complete initial assessment. MOON completed. TOC will follow for dc needs.                  Expected Discharge Plan: Home/Self Care Barriers to Discharge: Continued Medical Work up   Patient Goals and CMS Choice Patient states their goals for this hospitalization and ongoing recovery are:: return home to [facility]          Expected Discharge Plan and Services       Living arrangements for the past 2 months: Independent Living Facility [facility])                                      Prior Living Arrangements/Services Living arrangements for the past 2 months: Independent Living Facility Pharmacologist) Lives with:: Self Patient language and need for interpreter reviewed:: Yes Do you feel safe going back to the place where you live?: Yes      Need for Family Participation in Patient Care: Yes (Comment) Care giver support system in place?: Yes (comment)   Criminal Activity/Legal Involvement Pertinent to Current Situation/Hospitalization: No - Comment as needed  Activities of Daily Living   ADL Screening (condition at time of admission) Independently performs ADLs?: Yes (appropriate for developmental age) Is the patient deaf or have difficulty hearing?: Yes Does the patient have difficulty seeing, even when wearing glasses/contacts?: No Does the patient have difficulty concentrating, remembering, or making decisions?: No  Permission Sought/Granted                  Emotional  Assessment Appearance:: Appears stated age Attitude/Demeanor/Rapport: Engaged Affect (typically observed): Accepting Orientation: : Oriented to Self, Oriented to Place, Oriented to  Time, Oriented to Situation Alcohol / Substance Use: Not Applicable Psych Involvement: No (comment)  Admission diagnosis:  Acute cholecystitis [K81.0] Hypokalemia [E87.6] Cholecystitis [K81.9] Patient Active Problem List   Diagnosis Date Noted   Other fatigue 09/09/2023   SOB (shortness of breath) 09/09/2023   Constipation 09/09/2023   Acute cholecystitis 09/09/2023   Elevated LFTs 09/09/2023   Hypokalemia 09/09/2023   Persistent atrial fibrillation (HCC) 06/04/2022   Cancer (HCC) 05/29/2022   Diverticulitis 05/29/2022   Hyperlipidemia 05/29/2022   Hypertension 05/29/2022   Chronic kidney disease, stage 3 unspecified (HCC) 05/29/2022   Hypercholesterolemia 05/29/2022   Pain of right hand 04/29/2022   Bradycardia 04/29/2022   Elevated LDL cholesterol level 04/06/2021   Mass of arm, left 01/31/2020   History of gout 01/31/2020   Essential hypertension 01/31/2020   Gout 02/02/2012   Tubular adenoma of colon 05/10/2011   PCP:  Tonna Frederic, MD Pharmacy:   [facility] - [place], [place] - [address] [place] [address] Phone: [phone] Fax: [phone]  [facility] #[id number] - [place], [place] - [address] [place]  [address] Phone: [phone] Fax: [phone]     Social Drivers of Health (SDOH) Social History: SDOH Screenings   Food Insecurity: No Food Insecurity (09/10/2023)  Housing: Low Risk  (09/10/2023)  Transportation Needs: No Transportation Needs (09/10/2023)  Utilities: Not At Risk (09/10/2023)  Alcohol Screen: Low Risk  (11/11/2022)  Depression (PHQ2-9): Low Risk  (07/24/2023)  Financial Resource Strain: Low Risk  (11/11/2022)  Physical Activity:  Sufficiently Active (11/11/2022)  Social Connections: Moderately Isolated (09/10/2023)  Stress: No Stress Concern Present (11/11/2022)  Tobacco Use: Medium Risk (09/10/2023)  Health Literacy: Adequate Health Literacy (11/11/2022)   SDOH Interventions:     Readmission Risk Interventions     No data to display

## 2023-09-10 NOTE — Progress Notes (Signed)
 Subjective: CC: Mr Philip Donaldson was found in bed with daughter at bedside.  Patient is experiencing mild to moderate pain that is worse with movement.  Pain is concentrated in the right upper quadrant with radiation to right flank.  Patient has a history of A-fib that is treated with Eliquis  at home.  Objective: Vital signs in last 24 hours: Temp:  [97.8 F (36.6 C)-99.3 F (37.4 C)] 98.3 F (36.8 C) (06/04 0551) Pulse Rate:  [81-98] 81 (06/04 0551) Resp:  [18-19] 18 (06/04 0551) BP: (120-171)/(72-94) 130/72 (06/04 0551) SpO2:  [97 %-98 %] 97 % (06/04 0551) Weight:  [87.1 kg-87.2 kg] 87.1 kg (06/03 1812) Last BM Date : 09/09/23  Intake/Output from previous day: 06/03 0701 - 06/04 0700 In: 552.8 [P.O.:240; I.V.:65; IV Piggyback:247.8] Out: 250 [Urine:250] Intake/Output this shift: No intake/output data recorded.  PE: Physical Exam Constitutional:      General: He is not in acute distress.    Appearance: Normal appearance.  Cardiovascular:     Rate and Rhythm: Normal rate.  Abdominal:     Palpations: Abdomen is soft. There is no mass.     Tenderness: There is abdominal tenderness in the right upper quadrant. There is no guarding. Positive signs include Murphy's sign.     Hernia: A hernia is present. Hernia is present in the umbilical area.  Skin:    General: Skin is warm and dry.  Neurological:     Mental Status: He is alert.  Psychiatric:        Behavior: Behavior normal.     Lab Results:  Recent Labs    09/09/23 1425 09/10/23 0437  WBC 12.6* 12.9*  HGB 14.2 12.8*  HCT 42.4 39.9  PLT 251.0 217   BMET Recent Labs    09/09/23 1425 09/10/23 0437  NA 135 132*  K 2.9* 4.6  CL 95* 98  CO2 32 25  GLUCOSE 121* 109*  BUN 18 14  CREATININE 1.11 0.55*  CALCIUM 9.1 8.4*   PT/INR No results for input(s): "LABPROT", "INR" in the last 72 hours. CMP     Component Value Date/Time   NA 132 (L) 09/10/2023 0437   NA 140 02/13/2023 1049   K 4.6 09/10/2023 0437    CL 98 09/10/2023 0437   CO2 25 09/10/2023 0437   GLUCOSE 109 (H) 09/10/2023 0437   BUN 14 09/10/2023 0437   BUN 16 02/13/2023 1049   CREATININE 0.55 (L) 09/10/2023 0437   CALCIUM 8.4 (L) 09/10/2023 0437   PROT 5.2 (L) 09/10/2023 0437   PROT 7.3 02/13/2023 1049   ALBUMIN 2.4 (L) 09/10/2023 0437   ALBUMIN 4.3 02/13/2023 1049   AST 49 (H) 09/10/2023 0437   ALT 46 (H) 09/10/2023 0437   ALKPHOS 82 09/10/2023 0437   BILITOT 1.2 09/10/2023 0437   BILITOT 0.8 02/13/2023 1049   GFRNONAA >60 09/10/2023 0437   GFRAA  10/17/2009 0047    >60        The eGFR has been calculated using the MDRD equation. This calculation has not been validated in all clinical situations. eGFR's persistently <60 mL/min signify possible Chronic Kidney Disease.   Lipase     Component Value Date/Time   LIPASE 49 09/09/2023 1837    Studies/Results: CT ABDOMEN PELVIS W CONTRAST Result Date: 09/09/2023 CLINICAL DATA:  89 year old with abdominal pain.  Fatigue. EXAM: CT ABDOMEN AND PELVIS WITH CONTRAST TECHNIQUE: Multidetector CT imaging of the abdomen and pelvis was performed using the standard protocol  following bolus administration of intravenous contrast. RADIATION DOSE REDUCTION: This exam was performed according to the departmental dose-optimization program which includes automated exposure control, adjustment of the mA and/or kV according to patient size and/or use of iterative reconstruction technique. CONTRAST:  100mL OMNIPAQUE IOHEXOL 300 MG/ML  SOLN COMPARISON:  None Available. FINDINGS: Lower chest: 6 mm right lower lobe pulmonary nodule, series 5, image 12. Hepatobiliary: No focal hepatic abnormality. The gallbladder is moderately distended. There are multiple calcified gallstones as well as a stone in the gallbladder neck. Moderate gallbladder wall thickening and pericholecystic inflammation. Trace pericholecystic fluid. The common bile duct is normal in caliber. Trace central intrahepatic biliary ductal  dilatation. Pancreas: No ductal dilatation or inflammation. Spleen: Normal in size without focal abnormality. Splenule inferiorly. Adrenals/Urinary Tract: No adrenal nodule. Bilateral water density lesions in the kidneys as well as low-density lesions too small to characterize. No further follow-up imaging is recommended. No hydronephrosis or renal calculi. Partially distended urinary bladder. No bladder wall thickening. Stomach/Bowel: Small hiatal hernia. No bowel obstruction or inflammatory change. Left colonic diverticulosis. No diverticulitis. Small volume of stool in the colon. Normal appendix. Vascular/Lymphatic: Aortic atherosclerosis. No aortic aneurysm. Moderate branch atherosclerosis few prominent periportal nodes measuring up to 8 mm, likely reactive in the setting. No suspicious lymphadenopathy. Reproductive: Enlarged prostate gland spanning 6 cm transverse causing mass effect on the bladder base. Other: Small fat containing umbilical hernia.  No free air. Musculoskeletal: Multilevel degenerative change in the spine. There are no acute or suspicious osseous abnormalities. IMPRESSION: 1. Findings consistent with acute cholecystitis. Multiple calcified gallstones, including a stone in the gallbladder neck. Moderate gallbladder wall thickening and pericholecystic inflammation. Trace pericholecystic fluid. 2. Left colonic diverticulosis without diverticulitis. 3. Enlarged prostate gland causing mass effect on the bladder base. 4. Small hiatal hernia. 5. A 6 mm right lower lobe pulmonary nodule. Per Fleischner Society Guidelines, recommend a non-contrast Chest CT at 6-12 months. If patient is high risk for malignancy, consider an additional non-contrast Chest CT at 18-24 months. If patient is low risk for malignancy, non-contrast Chest CT at 18-24 months is optional. These guidelines do not apply to immunocompromised patients and patients with cancer. Follow up in patients with significant comorbidities as  clinically warranted. For lung cancer screening, adhere to Lung-RADS guidelines. Reference: Radiology. 2017; 284(1):228-43. Aortic Atherosclerosis (ICD10-I70.0). Electronically Signed   By: Chadwick Colonel M.D.   On: 09/09/2023 20:05    Anti-infectives: Anti-infectives (From admission, onward)    Start     Dose/Rate Route Frequency Ordered Stop   09/10/23 0800  piperacillin-tazobactam (ZOSYN) IVPB 3.375 g        3.375 g 12.5 mL/hr over 240 Minutes Intravenous Every 8 hours 09/10/23 0234     09/09/23 2300  piperacillin-tazobactam (ZOSYN) IVPB 3.375 g        3.375 g 100 mL/hr over 30 Minutes Intravenous  Once 09/09/23 2253 09/10/23 0742        Assessment/Plan Acute cholecystitis   Pain managed with fentanyl injection PRN. Surgery held until 48 hours of held eliquis . Will consult my attending regarding plans of exact time of OR.    FEN - CLD VTE - Heparin drip, SCDs ID - Zosyn Foley - None Plan - Admit and continue monitoring for OR  I reviewed nursing notes, ED provider notes, last 24 h vitals and pain scores, last 48 h intake and output, last 24 h labs and trends, and last 24 h imaging results.  This care required moderate level of medical  decision making.    LOS: 0 days    Mirta Ammon, [facility] 09/10/2023, 10:07 AM Please see Amion for pager number during day hours 7:00am-4:30pm

## 2023-09-10 NOTE — Plan of Care (Signed)
  Problem: Clinical Measurements: Goal: Diagnostic test results will improve Outcome: Progressing   Problem: Elimination: Goal: Will not experience complications related to urinary retention Outcome: Completed/Met

## 2023-09-10 NOTE — Progress Notes (Signed)
Consent signed and on chart.

## 2023-09-10 NOTE — Progress Notes (Signed)
 Mobility Specialist - Progress Note   09/10/23 0931  Mobility  Activity Ambulated with assistance in hallway;Ambulated with assistance to bathroom  Level of Assistance Standby assist, set-up cues, supervision of patient - no hands on  Assistive Device [employer] Ambulated (ft) 460 ft  Activity Response Tolerated well  Mobility Referral Yes  Mobility visit 1 Mobility  Mobility Specialist Start Time (ACUTE ONLY) 0913  Mobility Specialist Stop Time (ACUTE ONLY) 0931  Mobility Specialist Time Calculation (min) (ACUTE ONLY) 18 min   Pt received in bed and agreeable to mobility. No complaints during session. Upon returning to room, pt requested assistance to the bathroom. Instructed pt to pull call bell. Daughter in room.  Pt to bathroom after session with all needs met.    [facility]

## 2023-09-10 NOTE — Progress Notes (Signed)
 PHARMACY - ANTICOAGULATION CONSULT NOTE  Pharmacy Consult for Heparin (PTA apixaban  on hold) Indication: atrial fibrillation  Allergies  Allergen Reactions   Clarithromycin     REACTION: dizziness   Indomethacin     Other Reaction(s): Unknown   Pneumococcal Vaccines     Local swelling at injection site that resolved after a few days.  Likely Arthus reaction   Tuberculin Tests     Pt unsure- just told not to take it again    Patient Measurements: Height: 5\' 6"  (167.6 cm) Weight: 87.1 kg (192 lb) IBW/kg (Calculated) : 63.8 HEPARIN DW (KG): 82  Vital Signs: Temp: 99.3 F (37.4 C) (06/04 0015) Temp Source: Oral (06/04 0015) BP: 171/94 (06/04 0015) Pulse Rate: 98 (06/04 0015)  Labs: Recent Labs    09/09/23 1425  HGB 14.2  HCT 42.4  PLT 251.0  CREATININE 1.11    Estimated Creatinine Clearance: 43 mL/min (by C-G formula based on SCr of 1.11 mg/dL).   Medical History: 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   Cancer (HCC)    skin Ca-melanoma- scalp   Chronic kidney disease, stage 3 unspecified (HCC) 05/29/2022   Diverticulitis    Elevated LDL cholesterol level 04/06/2021   Essential hypertension 01/31/2020   Gout 02/02/2012   History of gout 01/31/2020   Hypercholesterolemia 05/29/2022   Hyperlipidemia    Hypertension    Mass of arm, left 01/31/2020   New onset atrial fibrillation (HCC) 06/04/2022   Pain of right hand 04/29/2022   Tubular adenoma of colon 05/10/2011    Medications:  PTA apixaban  5mg  BID - LD 09/09/23 @ 0730  Assessment:  89 yr male admitted with acute cholecystitis.  Plan for surgery in 48 hours.  PMH significant for AFib and patient on Apixaban  PTA. DOAC can falsely elevate heparin level, so will monitor heparin therapy with aPTT until effects of recent DOAC use wear off and aPTT and heparin levels correlate  Goal of Therapy:  Heparin level 0.3-0.7 units/ml aPTT 66-102 seconds Monitor platelets by anticoagulation  protocol: Yes   Plan:  Heparin 4000 unit IV bolus x 1 (last apix dose 6/3 @ 0730) Heparin gtt @ 1150 units/hr Check aPTT and heparin level 8 hr after heparin started Daily heparin level & CBC Follow for plan for holding IV heparin infusion prior to surgery  Adylee Leonardo, Gabriel John, PharmD 09/10/2023,3:01 AM

## 2023-09-10 NOTE — Anesthesia Preprocedure Evaluation (Signed)
 Anesthesia Evaluation    Reviewed: Allergy & Precautions, Patient's Chart, lab work & pertinent test results, Unable to perform ROS - Chart review only  Airway Mallampati: II  TM Distance: >3 FB Neck ROM: Full    Dental  (+) Dental Advisory Given, Poor Dentition, Missing, Partial Upper, Partial Lower,    Pulmonary former smoker   Pulmonary exam normal breath sounds clear to auscultation       Cardiovascular hypertension, Pt. on medications and Pt. on home beta blockers + dysrhythmias Atrial Fibrillation  Rhythm:Irregular Rate:Normal  06/2022 TTE  1. Left ventricular ejection fraction, by estimation, is 55 to 60%. The  left ventricle has normal function. The left ventricle has no regional  wall motion abnormalities. Left ventricular diastolic parameters are  indeterminate.   2. Right ventricular systolic function is mildly reduced. The right  ventricular size is normal. There is mildly elevated pulmonary artery  systolic pressure. The estimated right ventricular systolic pressure is  37.8 mmHg.   3. Left atrial size was mildly dilated.   4. The mitral valve is normal in structure. No evidence of mitral valve  regurgitation. No evidence of mitral stenosis.   5. The aortic valve is tricuspid. Aortic valve regurgitation is not  visualized. Aortic valve sclerosis is present, with no evidence of aortic  valve stenosis.   6. Aortic dilatation noted. There is dilatation of the ascending aorta,  measuring 41 mm.   7. The inferior vena cava is normal in size with greater than 50%  respiratory variability, suggesting right atrial pressure of 3 mmHg.      Neuro/Psych    GI/Hepatic   Endo/Other    Renal/GU Renal InsufficiencyRenal diseaseLab Results      Component                Value               Date                        K                        4.6                 09/10/2023                CO2                      25                   09/10/2023                BUN                      14                  09/10/2023                CREATININE               0.55 (L)            09/10/2023                              GLUCOSE                  109 (H)  09/10/2023                Musculoskeletal   Abdominal   Peds  Hematology Lab Results      Component                Value               Date                      WBC                      12.9 (H)            09/10/2023                HGB                      12.8 (L)            09/10/2023                HCT                      39.9                09/10/2023                MCV                      97.6                09/10/2023                PLT                      217                 09/10/2023              Anesthesia Other Findings ALL: clarithromycin, indomethacin, TB test, pneumoccocal vaccines  Reproductive/Obstetrics                             Anesthesia Physical Anesthesia Plan  ASA: 3  Anesthesia Plan: General   Post-op Pain Management: Ofirmev IV (intra-op)*   Induction: Intravenous  PONV Risk Score and Plan: 4 or greater and Ondansetron, Dexamethasone and Treatment may vary due to age or medical condition  Airway Management Planned: Oral ETT  Additional Equipment:   Intra-op Plan:   Post-operative Plan: Extubation in OR  Informed Consent: I have reviewed the patients History and Physical, chart, labs and discussed the procedure including the risks, benefits and alternatives for the proposed anesthesia with the patient or authorized representative who has indicated his/her understanding and acceptance.     Dental advisory given  Plan Discussed with: CRNA  Anesthesia Plan Comments:         Anesthesia Quick Evaluation

## 2023-09-10 NOTE — Progress Notes (Signed)
 [facility] ROUNDING NOTE MANUELITO POAGE [id number]  DOB: 1928/12/24  DOA: 09/09/2023  PCP: Tonna Frederic, MD  09/10/2023,7:47 AM  LOS: 0 days    Code Status: Full code   from: Home current Dispo: Unclear   89 year old male home dwelling Known paroxysmal A-fib CHA2DS2-VASc >4 on Eliquis  PTA, HTN, HLD Went to PCP 6/three 3-week history of fatigue SOB DOE anorexia-at baseline sleeps on 1 pillow but has become more short of breath on lying down Lab work showed elevated WBC LFT  Directed to ED 6/4 WBC 12.6 hemoglobin 14.2 platelet 251 AST/ALT 79/6.4 alk phos 141 bilirubin 1 potassium 2.9 BUN/creatinine 18/1.1 CT ABD pelvis acute cholecystitis multiple calcified gallstones including stone gallbladder neck moderate gallbladder wall thickening pericholecystic inflammation, 6 mm right pulmonary nodule General Surgery consulted   Plan  Acute cholecystitis Defer planning to general surgery-Eliquis  on hold Pain control change fentanyl to morphine 1-2 every 4 as needed Saline 50 cc/H with D5LR as probably n.p.o./clear liquid Continue Zosyn for intra-abdominal coverage  Paroxysmal A-fib CHADVASC >4 Eliquis  held heparin GTT Continue metoprolol  50 twice daily rate control  HTN continue lisinopril  40, amlodipine  10  Severe hypokalemia on admission, Replaced-check mag in a.m.  DVT prophylaxis: on IV heparin  Status is: Observation The patient will require care spanning > 2 midnights and should be moved to inpatient because:   Probably require surgery      Subjective: Looks well feels fair pain 5/10 no bloating no nausea no vomiting  Objective + exam Vitals:   09/09/23 1812 09/09/23 2141 09/10/23 0015 09/10/23 0551  BP: (!) 153/79 (!) 152/91 (!) 171/94 130/72  Pulse: 84 87 98 81  Resp: 18 18 19 18   Temp: 98.3 F (36.8 C) 98.7 F (37.1 C) 99.3 F (37.4 C) 98.3 F (36.8 C)  TempSrc: Oral Oral Oral Oral  SpO2: 97% 97% 98% 97%  Weight: 87.1 kg     Height: 5\' 6"  (1.676 m)       Filed Weights   09/09/23 1812  Weight: 87.1 kg    Examination: EOMI NCAT no focal deficit Numerous skin tags on back S1-S2 no murmur Abdominal distended bloated, has umbilical hernia Trace lower extremity edema ROM intact Power 5/5  Data Reviewed: reviewed   CBC    Component Value Date/Time   WBC 12.9 (H) 09/10/2023 0437   RBC 4.09 (L) 09/10/2023 0437   HGB 12.8 (L) 09/10/2023 0437   HGB 16.5 02/13/2023 1049   HCT 39.9 09/10/2023 0437   HCT 50.5 02/13/2023 1049   PLT 217 09/10/2023 0437   PLT 207 02/13/2023 1049   MCV 97.6 09/10/2023 0437   MCV 96 02/13/2023 1049   MCH 31.3 09/10/2023 0437   MCHC 32.1 09/10/2023 0437   RDW 13.3 09/10/2023 0437   RDW 12.5 02/13/2023 1049   LYMPHSABS 1.3 09/10/2023 0437   MONOABS 1.5 (H) 09/10/2023 0437   EOSABS 0.1 09/10/2023 0437   BASOSABS 0.1 09/10/2023 0437      Latest Ref Rng & Units 09/10/2023    4:37 AM 09/09/2023    2:25 PM 07/24/2023   11:53 AM  CMP  Glucose 70 - 99 mg/dL 562  130  89   BUN 8 - 23 mg/dL 14  18  18    Creatinine 0.61 - 1.24 mg/dL 8.65  7.84  6.96   Sodium 135 - 145 mmol/L 132  135  136   Potassium 3.5 - 5.1 mmol/L 4.6  2.9  3.9   Chloride 98 - 111  mmol/L 98  95  98   CO2 22 - 32 mmol/L 25  32  32   Calcium 8.9 - 10.3 mg/dL 8.4  9.1  04.5   Total Protein 6.5 - 8.1 g/dL 5.2  6.2    Total Bilirubin 0.0 - 1.2 mg/dL 1.2  1.0    Alkaline Phos 38 - 126 U/L 82  141    AST 15 - 41 U/L 49  79    ALT 0 - 44 U/L 46  64      Scheduled Meds:  amLODipine   10 mg Oral Daily   lisinopril   40 mg Oral Daily   metoprolol  tartrate  50 mg Oral BID   timolol  1 drop Both Eyes Daily   Continuous Infusions:  heparin 1,150 Units/hr (09/10/23 0334)   piperacillin-tazobactam (ZOSYN)  IV     potassium chloride 10 mEq (09/10/23 0717)    Time  33  Jai-Gurmukh Jacoba Cherney, MD  [facility]

## 2023-09-10 NOTE — H&P (View-Only) (Signed)
 Subjective: CC: Mr Lupe was found in bed with daughter at bedside.  Patient is experiencing mild to moderate pain that is worse with movement.  Pain is concentrated in the right upper quadrant with radiation to right flank.  Patient has a history of A-fib that is treated with Eliquis  at home.  Objective: Vital signs in last 24 hours: Temp:  [97.8 F (36.6 C)-99.3 F (37.4 C)] 98.3 F (36.8 C) (06/04 0551) Pulse Rate:  [81-98] 81 (06/04 0551) Resp:  [18-19] 18 (06/04 0551) BP: (120-171)/(72-94) 130/72 (06/04 0551) SpO2:  [97 %-98 %] 97 % (06/04 0551) Weight:  [87.1 kg-87.2 kg] 87.1 kg (06/03 1812) Last BM Date : 09/09/23  Intake/Output from previous day: 06/03 0701 - 06/04 0700 In: 552.8 [P.O.:240; I.V.:65; IV Piggyback:247.8] Out: 250 [Urine:250] Intake/Output this shift: No intake/output data recorded.  PE: Physical Exam Constitutional:      General: He is not in acute distress.    Appearance: Normal appearance.  Cardiovascular:     Rate and Rhythm: Normal rate.  Abdominal:     Palpations: Abdomen is soft. There is no mass.     Tenderness: There is abdominal tenderness in the right upper quadrant. There is no guarding. Positive signs include Murphy's sign.     Hernia: A hernia is present. Hernia is present in the umbilical area.  Skin:    General: Skin is warm and dry.  Neurological:     Mental Status: He is alert.  Psychiatric:        Behavior: Behavior normal.     Lab Results:  Recent Labs    09/09/23 1425 09/10/23 0437  WBC 12.6* 12.9*  HGB 14.2 12.8*  HCT 42.4 39.9  PLT 251.0 217   BMET Recent Labs    09/09/23 1425 09/10/23 0437  NA 135 132*  K 2.9* 4.6  CL 95* 98  CO2 32 25  GLUCOSE 121* 109*  BUN 18 14  CREATININE 1.11 0.55*  CALCIUM 9.1 8.4*   PT/INR No results for input(s): "LABPROT", "INR" in the last 72 hours. CMP     Component Value Date/Time   NA 132 (L) 09/10/2023 0437   NA 140 02/13/2023 1049   K 4.6 09/10/2023 0437    CL 98 09/10/2023 0437   CO2 25 09/10/2023 0437   GLUCOSE 109 (H) 09/10/2023 0437   BUN 14 09/10/2023 0437   BUN 16 02/13/2023 1049   CREATININE 0.55 (L) 09/10/2023 0437   CALCIUM 8.4 (L) 09/10/2023 0437   PROT 5.2 (L) 09/10/2023 0437   PROT 7.3 02/13/2023 1049   ALBUMIN 2.4 (L) 09/10/2023 0437   ALBUMIN 4.3 02/13/2023 1049   AST 49 (H) 09/10/2023 0437   ALT 46 (H) 09/10/2023 0437   ALKPHOS 82 09/10/2023 0437   BILITOT 1.2 09/10/2023 0437   BILITOT 0.8 02/13/2023 1049   GFRNONAA >60 09/10/2023 0437   GFRAA  10/17/2009 0047    >60        The eGFR has been calculated using the MDRD equation. This calculation has not been validated in all clinical situations. eGFR's persistently <60 mL/min signify possible Chronic Kidney Disease.   Lipase     Component Value Date/Time   LIPASE 49 09/09/2023 1837    Studies/Results: CT ABDOMEN PELVIS W CONTRAST Result Date: 09/09/2023 CLINICAL DATA:  89 year old with abdominal pain.  Fatigue. EXAM: CT ABDOMEN AND PELVIS WITH CONTRAST TECHNIQUE: Multidetector CT imaging of the abdomen and pelvis was performed using the standard protocol  following bolus administration of intravenous contrast. RADIATION DOSE REDUCTION: This exam was performed according to the departmental dose-optimization program which includes automated exposure control, adjustment of the mA and/or kV according to patient size and/or use of iterative reconstruction technique. CONTRAST:  100mL OMNIPAQUE IOHEXOL 300 MG/ML  SOLN COMPARISON:  None Available. FINDINGS: Lower chest: 6 mm right lower lobe pulmonary nodule, series 5, image 12. Hepatobiliary: No focal hepatic abnormality. The gallbladder is moderately distended. There are multiple calcified gallstones as well as a stone in the gallbladder neck. Moderate gallbladder wall thickening and pericholecystic inflammation. Trace pericholecystic fluid. The common bile duct is normal in caliber. Trace central intrahepatic biliary ductal  dilatation. Pancreas: No ductal dilatation or inflammation. Spleen: Normal in size without focal abnormality. Splenule inferiorly. Adrenals/Urinary Tract: No adrenal nodule. Bilateral water density lesions in the kidneys as well as low-density lesions too small to characterize. No further follow-up imaging is recommended. No hydronephrosis or renal calculi. Partially distended urinary bladder. No bladder wall thickening. Stomach/Bowel: Small hiatal hernia. No bowel obstruction or inflammatory change. Left colonic diverticulosis. No diverticulitis. Small volume of stool in the colon. Normal appendix. Vascular/Lymphatic: Aortic atherosclerosis. No aortic aneurysm. Moderate branch atherosclerosis few prominent periportal nodes measuring up to 8 mm, likely reactive in the setting. No suspicious lymphadenopathy. Reproductive: Enlarged prostate gland spanning 6 cm transverse causing mass effect on the bladder base. Other: Small fat containing umbilical hernia.  No free air. Musculoskeletal: Multilevel degenerative change in the spine. There are no acute or suspicious osseous abnormalities. IMPRESSION: 1. Findings consistent with acute cholecystitis. Multiple calcified gallstones, including a stone in the gallbladder neck. Moderate gallbladder wall thickening and pericholecystic inflammation. Trace pericholecystic fluid. 2. Left colonic diverticulosis without diverticulitis. 3. Enlarged prostate gland causing mass effect on the bladder base. 4. Small hiatal hernia. 5. A 6 mm right lower lobe pulmonary nodule. Per Fleischner Society Guidelines, recommend a non-contrast Chest CT at 6-12 months. If patient is high risk for malignancy, consider an additional non-contrast Chest CT at 18-24 months. If patient is low risk for malignancy, non-contrast Chest CT at 18-24 months is optional. These guidelines do not apply to immunocompromised patients and patients with cancer. Follow up in patients with significant comorbidities as  clinically warranted. For lung cancer screening, adhere to Lung-RADS guidelines. Reference: Radiology. 2017; 284(1):228-43. Aortic Atherosclerosis (ICD10-I70.0). Electronically Signed   By: Chadwick Colonel M.D.   On: 09/09/2023 20:05    Anti-infectives: Anti-infectives (From admission, onward)    Start     Dose/Rate Route Frequency Ordered Stop   09/10/23 0800  piperacillin-tazobactam (ZOSYN) IVPB 3.375 g        3.375 g 12.5 mL/hr over 240 Minutes Intravenous Every 8 hours 09/10/23 0234     09/09/23 2300  piperacillin-tazobactam (ZOSYN) IVPB 3.375 g        3.375 g 100 mL/hr over 30 Minutes Intravenous  Once 09/09/23 2253 09/10/23 0742        Assessment/Plan Acute cholecystitis   Pain managed with fentanyl injection PRN. Surgery held until 48 hours of held eliquis . Will consult my attending regarding plans of exact time of OR.    FEN - CLD VTE - Heparin drip, SCDs ID - Zosyn Foley - None Plan - Admit and continue monitoring for OR  I reviewed nursing notes, ED provider notes, last 24 h vitals and pain scores, last 48 h intake and output, last 24 h labs and trends, and last 24 h imaging results.  This care required moderate level of medical  decision making.    LOS: 0 days    Mirta Ammon, [facility] 09/10/2023, 10:07 AM Please see Amion for pager number during day hours 7:00am-4:30pm

## 2023-09-10 NOTE — Progress Notes (Signed)
 PHARMACY - ANTICOAGULATION CONSULT NOTE  Pharmacy Consult for heparin Indication: afib (apixaban  on hold)  Allergies  Allergen Reactions   Clarithromycin     REACTION: dizziness   Indomethacin     Other Reaction(s): Unknown   Pneumococcal Vaccines     Local swelling at injection site that resolved after a few days.  Likely Arthus reaction   Tuberculin Tests     Pt unsure- just told not to take it again    Patient Measurements: Height: 5\' 6"  (167.6 cm) Weight: 87.1 kg (192 lb) IBW/kg (Calculated) : 63.8 HEPARIN DW (KG): 82  Vital Signs: Temp: 98.3 F (36.8 C) (06/04 0551) Temp Source: Oral (06/04 0551) BP: 130/72 (06/04 0551) Pulse Rate: 81 (06/04 0551)  Labs: Recent Labs    09/09/23 1425 09/10/23 0437  HGB 14.2 12.8*  HCT 42.4 39.9  PLT 251.0 217  CREATININE 1.11 0.55*    Estimated Creatinine Clearance: 59.6 mL/min (A) (by C-G formula based on SCr of 0.55 mg/dL (L)).   Medical History: 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   Cancer (HCC)    skin Ca-melanoma- scalp   Chronic kidney disease, stage 3 unspecified (HCC) 05/29/2022   Diverticulitis    Elevated LDL cholesterol level 04/06/2021   Essential hypertension 01/31/2020   Gout 02/02/2012   History of gout 01/31/2020   Hypercholesterolemia 05/29/2022   Hyperlipidemia    Hypertension    Mass of arm, left 01/31/2020   New onset atrial fibrillation (HCC) 06/04/2022   Pain of right hand 04/29/2022   Tubular adenoma of colon 05/10/2011    Medications: Apixaban  5 mg PO BID PTA -Last dose reported taken 6/3 AM  Assessment: Pt is a 17 yoM presenting with abdominal pain. CCS consulted, planning surgical intervention for acute cholecystitis. Pharmacy consulted to dose heparin while apixaban  on hold for surgery.   Today, 09/10/23 aPTT = 58 seconds is subtherapeutic on heparin infusion of 1150 units/hr Heparin level = 1 is falsely elevated due to recent DOAC CBC: Hgb slightly  low/decreased - could be hemodilution. Plt WNL No bleeding reported.  Goal of Therapy:  Heparin level 0.3-0.7 units/ml aPTT 66-102 seconds Monitor platelets by anticoagulation protocol: Yes   Plan:  Heparin bolus of 1000 units IV once Increase rate of heparin infusion to 1300 units/hr Check aPTT 8 hours after rate change CBC, heparin level, aPTT daily Once heparin level and aPTT correlate, monitor using heparin level only Monitor for signs of bleeding  Reached out to CCS team and asked them to notify pharmacy of when to hold heparin prior to surgery once OR schedule determined.   Shireen Dory, PharmD 09/10/2023,10:57 AM

## 2023-09-11 ENCOUNTER — Encounter (HOSPITAL_COMMUNITY)
Admission: EM | Disposition: A | Discharge status: Home / Self Care | Payer: Self-pay | Source: Home / Self Care | Attending: Family Medicine

## 2023-09-11 ENCOUNTER — Other Ambulatory Visit: Payer: Self-pay

## 2023-09-11 ENCOUNTER — Inpatient Hospital Stay (HOSPITAL_COMMUNITY): Payer: Self-pay

## 2023-09-11 DIAGNOSIS — K8000 Calculus of gallbladder with acute cholecystitis without obstruction: Secondary | ICD-10-CM | POA: Diagnosis not present

## 2023-09-11 DIAGNOSIS — N183 Chronic kidney disease, stage 3 unspecified: Secondary | ICD-10-CM | POA: Diagnosis not present

## 2023-09-11 DIAGNOSIS — I129 Hypertensive chronic kidney disease with stage 1 through stage 4 chronic kidney disease, or unspecified chronic kidney disease: Secondary | ICD-10-CM | POA: Diagnosis not present

## 2023-09-11 DIAGNOSIS — K810 Acute cholecystitis: Secondary | ICD-10-CM | POA: Diagnosis not present

## 2023-09-11 DIAGNOSIS — I4891 Unspecified atrial fibrillation: Secondary | ICD-10-CM

## 2023-09-11 HISTORY — PX: CHOLECYSTECTOMY: SHX55

## 2023-09-11 HISTORY — PX: INDOCYANINE GREEN FLUORESCENCE IMAGING (ICG): SHX7595

## 2023-09-11 LAB — CBC WITH DIFFERENTIAL/PLATELET
Abs Immature Granulocytes: 0.13 10*3/uL — ABNORMAL HIGH (ref 0.00–0.07)
Basophils Absolute: 0.1 10*3/uL (ref 0.0–0.1)
Basophils Relative: 1 %
Eosinophils Absolute: 0.3 10*3/uL (ref 0.0–0.5)
Eosinophils Relative: 3 %
HCT: 41.8 % (ref 39.0–52.0)
Hemoglobin: 13.8 g/dL (ref 13.0–17.0)
Immature Granulocytes: 2 %
Lymphocytes Relative: 17 %
Lymphs Abs: 1.4 10*3/uL (ref 0.7–4.0)
MCH: 31.8 pg (ref 26.0–34.0)
MCHC: 33 g/dL (ref 30.0–36.0)
MCV: 96.3 fL (ref 80.0–100.0)
Monocytes Absolute: 1 10*3/uL (ref 0.1–1.0)
Monocytes Relative: 11 %
Neutro Abs: 5.8 10*3/uL (ref 1.7–7.7)
Neutrophils Relative %: 66 %
Platelets: 237 10*3/uL (ref 150–400)
RBC: 4.34 MIL/uL (ref 4.22–5.81)
RDW: 13.3 % (ref 11.5–15.5)
WBC: 8.6 10*3/uL (ref 4.0–10.5)
nRBC: 0 % (ref 0.0–0.2)

## 2023-09-11 LAB — APTT: aPTT: 57 s — ABNORMAL HIGH (ref 24–36)

## 2023-09-11 LAB — BASIC METABOLIC PANEL WITH GFR
Anion gap: 10 (ref 5–15)
BUN: <5 mg/dL — ABNORMAL LOW (ref 8–23)
CO2: 25 mmol/L (ref 22–32)
Calcium: 8.9 mg/dL (ref 8.9–10.3)
Chloride: 101 mmol/L (ref 98–111)
Creatinine, Ser: <0.3 mg/dL — ABNORMAL LOW (ref 0.61–1.24)
Glucose, Bld: 105 mg/dL — ABNORMAL HIGH (ref 70–99)
Potassium: 2.8 mmol/L — ABNORMAL LOW (ref 3.5–5.1)
Sodium: 136 mmol/L (ref 135–145)

## 2023-09-11 LAB — MAGNESIUM: Magnesium: 1.8 mg/dL (ref 1.7–2.4)

## 2023-09-11 SURGERY — LAPAROSCOPIC CHOLECYSTECTOMY
Anesthesia: General

## 2023-09-11 MED ORDER — ACETAMINOPHEN 500 MG PO TABS
1000.0000 mg | ORAL_TABLET | Freq: Three times a day (TID) | ORAL | Status: DC
  Administered 2023-09-11 – 2023-09-14 (×8): 1000 mg via ORAL
  Filled 2023-09-11 (×8): qty 2

## 2023-09-11 MED ORDER — FENTANYL CITRATE PF 50 MCG/ML IJ SOSY
25.0000 ug | PREFILLED_SYRINGE | INTRAMUSCULAR | Status: DC | PRN
  Administered 2023-09-11 (×3): 50 ug via INTRAVENOUS

## 2023-09-11 MED ORDER — FENTANYL CITRATE (PF) 100 MCG/2ML IJ SOLN
INTRAMUSCULAR | Status: AC
  Filled 2023-09-11: qty 2

## 2023-09-11 MED ORDER — FENTANYL CITRATE (PF) 100 MCG/2ML IJ SOLN
INTRAMUSCULAR | Status: DC | PRN
Start: 2023-09-11 — End: 2023-09-11
  Administered 2023-09-11: 100 ug via INTRAVENOUS

## 2023-09-11 MED ORDER — ONDANSETRON HCL 4 MG/2ML IJ SOLN: 4.0000 mg | Freq: Once | INTRAMUSCULAR | Status: DC | PRN

## 2023-09-11 MED ORDER — PHENYLEPHRINE HCL-NACL 20-0.9 MG/250ML-% IV SOLN
INTRAVENOUS | Status: DC | PRN
Start: 2023-09-11 — End: 2023-09-11
  Administered 2023-09-11: 25 ug/min via INTRAVENOUS

## 2023-09-11 MED ORDER — PHENYLEPHRINE HCL (PRESSORS) 10 MG/ML IV SOLN
INTRAVENOUS | Status: DC | PRN
Start: 2023-09-11 — End: 2023-09-11
  Administered 2023-09-11 (×2): 80 ug via INTRAVENOUS
  Administered 2023-09-11: 40 ug via INTRAVENOUS

## 2023-09-11 MED ORDER — METOPROLOL TARTRATE 12.5 MG HALF TABLET: 12.5000 mg | ORAL_TABLET | Freq: Two times a day (BID) | ORAL | Status: DC

## 2023-09-11 MED ORDER — PROPOFOL 10 MG/ML IV BOLUS
INTRAVENOUS | Status: AC
  Filled 2023-09-11: qty 20

## 2023-09-11 MED ORDER — FENTANYL CITRATE PF 50 MCG/ML IJ SOSY
PREFILLED_SYRINGE | INTRAMUSCULAR | Status: AC
  Filled 2023-09-11: qty 1

## 2023-09-11 MED ORDER — ROCURONIUM BROMIDE 100 MG/10ML IV SOLN
INTRAVENOUS | Status: DC | PRN
  Administered 2023-09-11: 30 mg via INTRAVENOUS
  Administered 2023-09-11: 50 mg via INTRAVENOUS

## 2023-09-11 MED ORDER — ONDANSETRON HCL 4 MG/2ML IJ SOLN
INTRAMUSCULAR | Status: AC
Start: 2023-09-11 — End: ?
  Filled 2023-09-11: qty 2

## 2023-09-11 MED ORDER — DEXAMETHASONE SODIUM PHOSPHATE 10 MG/ML IJ SOLN
INTRAMUSCULAR | Status: AC
  Filled 2023-09-11: qty 1

## 2023-09-11 MED ORDER — HEPARIN (PORCINE) 25000 UT/250ML-% IV SOLN
1600.0000 [IU]/h | INTRAVENOUS | Status: DC
  Administered 2023-09-11 – 2023-09-13 (×3): 1600 [IU]/h via INTRAVENOUS
  Filled 2023-09-11 (×2): qty 250

## 2023-09-11 MED ORDER — PHENYLEPHRINE 80 MCG/ML (10ML) SYRINGE FOR IV PUSH (FOR BLOOD PRESSURE SUPPORT)
PREFILLED_SYRINGE | INTRAVENOUS | Status: AC
  Filled 2023-09-11: qty 10

## 2023-09-11 MED ORDER — ROCURONIUM BROMIDE 10 MG/ML (PF) SYRINGE
PREFILLED_SYRINGE | INTRAVENOUS | Status: AC
  Filled 2023-09-11: qty 10

## 2023-09-11 MED ORDER — LIDOCAINE HCL 1 % IJ SOLN
INTRAMUSCULAR | Status: AC
  Filled 2023-09-11: qty 20

## 2023-09-11 MED ORDER — LIDOCAINE HCL (PF) 2 % IJ SOLN
INTRAMUSCULAR | Status: AC
  Filled 2023-09-11: qty 5

## 2023-09-11 MED ORDER — ACETAMINOPHEN 10 MG/ML IV SOLN: 1000.0000 mg | Freq: Once | INTRAVENOUS | Status: DC | PRN

## 2023-09-11 MED ORDER — ALBUTEROL SULFATE HFA 108 (90 BASE) MCG/ACT IN AERS
INHALATION_SPRAY | RESPIRATORY_TRACT | Status: AC
  Filled 2023-09-11: qty 6.7

## 2023-09-11 MED ORDER — DEXMEDETOMIDINE HCL IN NACL 80 MCG/20ML IV SOLN
INTRAVENOUS | Status: AC
  Filled 2023-09-11: qty 20

## 2023-09-11 MED ORDER — POTASSIUM CHLORIDE IN NACL 40-0.9 MEQ/L-% IV SOLN
INTRAVENOUS | Status: DC
  Filled 2023-09-11 (×4): qty 1000

## 2023-09-11 MED ORDER — PROPOFOL 10 MG/ML IV BOLUS
INTRAVENOUS | Status: DC | PRN
  Administered 2023-09-11: 80 mg via INTRAVENOUS

## 2023-09-11 MED ORDER — SUGAMMADEX SODIUM 200 MG/2ML IV SOLN
INTRAVENOUS | Status: DC | PRN
  Administered 2023-09-11: 350 mg via INTRAVENOUS

## 2023-09-11 MED ORDER — LACTATED RINGERS IV SOLN
INTRAVENOUS | Status: DC | PRN
Start: 2023-09-11 — End: 2023-09-11

## 2023-09-11 MED ORDER — POTASSIUM CHLORIDE 10 MEQ/100ML IV SOLN
10.0000 meq | INTRAVENOUS | Status: AC
  Administered 2023-09-11 (×4): 10 meq via INTRAVENOUS
  Filled 2023-09-11 (×2): qty 100

## 2023-09-11 MED ORDER — ONDANSETRON HCL 4 MG/2ML IJ SOLN
INTRAMUSCULAR | Status: DC | PRN
  Administered 2023-09-11: 4 mg via INTRAVENOUS

## 2023-09-11 MED ORDER — ACETAMINOPHEN 10 MG/ML IV SOLN
INTRAVENOUS | Status: DC | PRN
  Administered 2023-09-11: 1000 mg via INTRAVENOUS

## 2023-09-11 MED ORDER — BUPIVACAINE-EPINEPHRINE (PF) 0.25% -1:200000 IJ SOLN
INTRAMUSCULAR | Status: DC | PRN
Start: 2023-09-11 — End: 2023-09-11
  Administered 2023-09-11: 10 mL via PERINEURAL

## 2023-09-11 MED ORDER — DEXAMETHASONE SODIUM PHOSPHATE 10 MG/ML IJ SOLN
INTRAMUSCULAR | Status: DC | PRN
Start: 2023-09-11 — End: 2023-09-11
  Administered 2023-09-11: 5 mg via INTRAVENOUS

## 2023-09-11 MED ORDER — ALBUTEROL SULFATE HFA 108 (90 BASE) MCG/ACT IN AERS
2.0000 | INHALATION_SPRAY | Freq: Four times a day (QID) | RESPIRATORY_TRACT | Status: DC | PRN
  Administered 2023-09-11 – 2023-09-13 (×3): 2 via RESPIRATORY_TRACT
  Filled 2023-09-11: qty 6.7

## 2023-09-11 MED ORDER — ACETAMINOPHEN 10 MG/ML IV SOLN
INTRAVENOUS | Status: AC
  Filled 2023-09-11: qty 100

## 2023-09-11 MED ORDER — LIDOCAINE HCL (PF) 1 % IJ SOLN
INTRAMUSCULAR | Status: DC | PRN
Start: 2023-09-11 — End: 2023-09-11
  Administered 2023-09-11: 10 mL

## 2023-09-11 MED ORDER — METOPROLOL TARTRATE 25 MG PO TABS
25.0000 mg | ORAL_TABLET | Freq: Two times a day (BID) | ORAL | Status: DC
  Administered 2023-09-11 – 2023-09-14 (×6): 25 mg via ORAL
  Filled 2023-09-11 (×6): qty 1

## 2023-09-11 MED ORDER — ALBUTEROL SULFATE HFA 108 (90 BASE) MCG/ACT IN AERS
INHALATION_SPRAY | RESPIRATORY_TRACT | Status: DC | PRN
Start: 2023-09-11 — End: 2023-09-11
  Administered 2023-09-11: 2 via RESPIRATORY_TRACT

## 2023-09-11 MED ORDER — LIDOCAINE HCL (CARDIAC) PF 100 MG/5ML IV SOSY
PREFILLED_SYRINGE | INTRAVENOUS | Status: DC | PRN
  Administered 2023-09-11: 80 mg via INTRAVENOUS

## 2023-09-11 MED ORDER — BUPIVACAINE-EPINEPHRINE (PF) 0.25% -1:200000 IJ SOLN
INTRAMUSCULAR | Status: AC
  Filled 2023-09-11: qty 30

## 2023-09-11 MED ORDER — DEXMEDETOMIDINE HCL IN NACL 80 MCG/20ML IV SOLN
INTRAVENOUS | Status: DC | PRN
Start: 2023-09-11 — End: 2023-09-11
  Administered 2023-09-11: 8 ug via INTRAVENOUS
  Administered 2023-09-11: 4 ug via INTRAVENOUS

## 2023-09-11 SURGICAL SUPPLY — 37 items
BAG COUNTER SPONGE SURGICOUNT (BAG) IMPLANT
CHLORAPREP W/TINT 26 (MISCELLANEOUS) ×1 IMPLANT
CLIP APPLIE ROT 10 11.4 M/L (STAPLE) ×1 IMPLANT
CLIP LIGATING HEMO LOK XL GOLD (MISCELLANEOUS) ×1 IMPLANT
CLIP LIGATING HEMO O LOK GREEN (MISCELLANEOUS) IMPLANT
COVER MAYO STAND STRL (DRAPES) IMPLANT
COVER SURGICAL LIGHT HANDLE (MISCELLANEOUS) ×1 IMPLANT
DERMABOND ADVANCED .7 DNX12 (GAUZE/BANDAGES/DRESSINGS) IMPLANT
DERMABOND ADVANCED .7 DNX6 (GAUZE/BANDAGES/DRESSINGS) IMPLANT
DRAIN CHANNEL 19F RND (DRAIN) IMPLANT
DRAPE C-ARM 42X120 X-RAY (DRAPES) IMPLANT
DRSG TEGADERM 4X4.5 CHG (GAUZE/BANDAGES/DRESSINGS) IMPLANT
ELECT REM PT RETURN 15FT ADLT (MISCELLANEOUS) ×1 IMPLANT
ENDOLOOP SUT PDS II 0 18 (SUTURE) IMPLANT
GAUZE SPONGE 4X4 12PLY STRL (GAUZE/BANDAGES/DRESSINGS) IMPLANT
GLOVE BIO SURGEON STRL SZ 6 (GLOVE) ×1 IMPLANT
GLOVE INDICATOR 6.5 STRL GRN (GLOVE) ×1 IMPLANT
GOWN STRL REUS W/ TWL XL LVL3 (GOWN DISPOSABLE) ×1 IMPLANT
HEMOSTAT SNOW SURGICEL 2X4 (HEMOSTASIS) IMPLANT
IRRIGATION SUCT STRKRFLW 2 WTP (MISCELLANEOUS) ×1 IMPLANT
KIT BASIN OR (CUSTOM PROCEDURE TRAY) ×1 IMPLANT
KIT TURNOVER KIT A (KITS) IMPLANT
LHOOK LAP DISP 36CM (ELECTROSURGICAL) ×1 IMPLANT
PENCIL SMOKE EVACUATOR (MISCELLANEOUS) IMPLANT
PROTECTOR NERVE ULNAR (MISCELLANEOUS) IMPLANT
SCISSORS LAP 5X35 DISP (ENDOMECHANICALS) ×1 IMPLANT
SET CHOLANGIOGRAPH MIX (MISCELLANEOUS) IMPLANT
SET TUBE SMOKE EVAC HIGH FLOW (TUBING) ×1 IMPLANT
SLEEVE Z-THREAD 5X100MM (TROCAR) ×1 IMPLANT
SPIKE FLUID TRANSFER (MISCELLANEOUS) ×1 IMPLANT
SUT MNCRL AB 4-0 PS2 18 (SUTURE) ×1 IMPLANT
SYSTEM BAG RETRIEVAL 10MM (BASKET) IMPLANT
TOWEL OR 17X26 10 PK STRL BLUE (TOWEL DISPOSABLE) ×1 IMPLANT
TRAY LAPAROSCOPIC (CUSTOM PROCEDURE TRAY) ×1 IMPLANT
TROCAR 11X100 Z THREAD (TROCAR) ×1 IMPLANT
TROCAR BALLN 12MMX100 BLUNT (TROCAR) ×1 IMPLANT
TROCAR Z-THREAD OPTICAL 5X100M (TROCAR) ×1 IMPLANT

## 2023-09-11 NOTE — Plan of Care (Signed)
  Problem: Clinical Measurements: Goal: Will remain free from infection Outcome: Progressing   Problem: Activity: Goal: Risk for activity intolerance will decrease Outcome: Progressing   

## 2023-09-11 NOTE — Op Note (Signed)
 Laparoscopic Cholecystectomy   Indications: This patient presents with acute calculous cholecystitis and will undergo laparoscopic cholecystectomy.  Pre-operative Diagnosis: acute calculous cholecystitis  Post-operative Diagnosis: Same  Surgeon: Lockie Rima   Assistants: Tonette Franco, RNFA  Anesthesia: General endotracheal anesthesia and local  ASA Class: 3  Procedure Details   The patient was seen again in the Holding Room. The risks, benefits, complications, treatment options, and expected outcomes were discussed with the patient. The possibilities of  bleeding, recurrent infection, damage to nearby structures, the need for additional procedures, failure to diagnose a condition, the possible need to convert to an open procedure, and creating a complication requiring transfusion or operation were discussed with the patient. The likelihood of improving the patient's symptoms with return to their baseline status is good.    The patient and/or family concurred with the proposed plan, giving informed consent. The site of surgery properly noted. The patient was taken to OR # 4 and placed supine on the OR table.  SCDs were placed. Antibiotic prophylaxis was administered. General endotracheal anesthesia was then administered and tolerated well. After the induction, the abdomen was prepped with Chloraprep and draped in the sterile fashion. and the procedure verified as Laparoscopic Cholecystectomy with possible Intraoperative Cholangiogram. A Time Out was held and the above information confirmed.  Local anesthetic agent was injected into the skin near the umbilicus and a curvilinear infraumbilical incision was made with a #11 blade. I dissected down to the abdominal fascia with blunt dissection.  The fascia was incised vertically and the peritoneal cavity was entered bluntly. There was an umbilical hernia present with omental adhesions present.  These were taken down sharply with the Metzenbaum  scissors.   A pursestring suture of 0-Vicryl was placed around the fascial opening.  The Hasson cannula was inserted and secured with the stay suture.  Pneumoperitoneum was then created with CO2 and tolerated well without any adverse changes in the patient's vital signs. An 11-mm port was placed in the subxiphoid position.  Two 5-mm ports were placed in the right upper quadrant. All skin incisions were infiltrated with a local anesthetic agent before making the incision and placing the trocars.   The patient was placed into reverse Trendelenburg position and was tilted slightly to the patient's left.  The gallbladder was identified, and there were numerous omental adhesions noted.  These were taken down with the cautery.  The gallbladder was aspirated as it was extremely tense and inflamed.  Blunt dissection was also used to push down some more of the adhesions from the infundibulum.  The gallbladder was dissected from the liver bed in retrograde fashion with the electrocautery.  The gallbladder wall was noted to be extremely thick.  At several points, it appeared that we were in the liver, so I went toward the gallbladder injury of the gallbladder.  The bile was aspirated and multiple stones were removed with the stone grasper.  The interface between the gallbladder wall and the liver was again identified and dissected, but was again very difficult to discern.  The gallbladder wall did not thin out more more proximally and was a bit necrotic.  As we closer to the infundibulum, became much more cautious.  On the inferior aspect, I was able to see a bit better where the cystic duct was.  The gallbladder wall and the cystic duct were so thickened, that the clipped was not going to be able to pass around it.  Cystic duct was also extremely shortened due to this  inflammation.  In order to avoid injuring the common bile duct, a Endoloop was passed around the gallbladder that had been dissected free, was passed down  just past the infundibulum.  This was tightened well in order to cinch closed the cystic duct.  The loop was carefully examined to make sure none of the duodenum or common duct was incorporated into the loop.  The the gallbladder was removed and placed in a retrieval bag.  The gallbladder and bag were then removed through the umbilical port site.  The liver bed was irrigated and inspected. The region was carefully examined for any stone remnants.  These were removed with the stone graspers.  Hemostasis was achieved with the electrocautery. Copious irrigation was utilized and was repeatedly aspirated until clear.  A 19 Fr blake drain was passed out the lateral most trocar site.  This was secured with a 2-0 nylon and placed into the gallbladder fossa.    Pneumoperitoneum was released as we removed the trocars.   The pursestring suture was used to close the umbilical fascia.  Three additional 0-0 vicryl was needed.  2-0 vicryl was used to secure the skin from the umbilical hernia down to the fascia.    4-0 Monocryl was used to close the skin in subcuticular fashion.   The skin was cleaned and dry, and Dermabond was applied. The patient was then extubated and brought to the recovery room in stable condition. Instrument, sponge, and needle counts were correct at closure and at the conclusion of the case.   Findings: Woody inflammation with extremely thickened gallbladder wall.    Estimated Blood Loss: min         Drains: 19 Fr Blake drain          Specimens: Gallbladder to pathology       Complications: None; patient tolerated the procedure well.         Disposition: PACU - hemodynamically stable.         Condition: stable

## 2023-09-11 NOTE — Anesthesia Postprocedure Evaluation (Signed)
 Anesthesia Post Note  Patient: Philip Donaldson  Procedure(s) Performed: LAPAROSCOPIC CHOLECYSTECTOMY INDOCYANINE GREEN FLUORESCENCE IMAGING (ICG)     Patient location during evaluation: PACU Anesthesia Type: General Level of consciousness: awake and alert Pain management: pain level controlled Vital Signs Assessment: post-procedure vital signs reviewed and stable Respiratory status: spontaneous breathing Cardiovascular status: stable Anesthetic complications: no   No notable events documented.  Last Vitals:  Vitals:   09/11/23 1832 09/11/23 1847  BP: 119/78 (!) 137/100  Pulse: 71 89  Resp: 14 20  Temp:    SpO2: 90% 96%    Last Pain:  Vitals:   09/11/23 1832  TempSrc:   PainSc: Asleep                 Gorman Laughter

## 2023-09-11 NOTE — Progress Notes (Signed)
 PHARMACY - ANTICOAGULATION CONSULT NOTE  Pharmacy Consult for heparin Indication: afib (apixaban  on hold)  Allergies  Allergen Reactions   Clarithromycin     REACTION: dizziness   Indomethacin     Other Reaction(s): Unknown   Pneumococcal Vaccines     Local swelling at injection site that resolved after a few days.  Likely Arthus reaction   Tuberculin Tests     Pt unsure- just told not to take it again    Patient Measurements: Height: 5\' 6"  (167.6 cm) Weight: 87.1 kg (192 lb) IBW/kg (Calculated) : 63.8 HEPARIN DW (KG): 82  Vital Signs: Temp: 98 F (36.7 C) (06/04 2156) Temp Source: Oral (06/04 2156) BP: 154/91 (06/04 2156) Pulse Rate: 75 (06/04 2156)  Labs: Recent Labs    09/09/23 1425 09/10/23 0437 09/10/23 1237 09/10/23 2326  HGB 14.2 12.8*  --   --   HCT 42.4 39.9  --   --   PLT 251.0 217  --   --   APTT  --   --  58* 57*  HEPARINUNFRC  --   --  1.00*  --   CREATININE 1.11 0.55*  --   --     Estimated Creatinine Clearance: 59.6 mL/min (A) (by C-G formula based on SCr of 0.55 mg/dL (L)).   Medical History: 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   Cancer (HCC)    skin Ca-melanoma- scalp   Chronic kidney disease, stage 3 unspecified (HCC) 05/29/2022   Diverticulitis    Elevated LDL cholesterol level 04/06/2021   Essential hypertension 01/31/2020   Gout 02/02/2012   History of gout 01/31/2020   Hypercholesterolemia 05/29/2022   Hyperlipidemia    Hypertension    Mass of arm, left 01/31/2020   New onset atrial fibrillation (HCC) 06/04/2022   Pain of right hand 04/29/2022   Tubular adenoma of colon 05/10/2011    Medications: Apixaban  5 mg PO BID PTA -Last dose reported taken 6/3 AM  Assessment: Pt is a 75 yoM presenting with abdominal pain. CCS consulted, planning surgical intervention for acute cholecystitis. Pharmacy consulted to dose heparin while apixaban  on hold for surgery.   Today, 09/11/23 aPTT = 57 seconds  remains subtherapeutic despite rate increase to  heparin 1300 units/hr Heparin level = 1 is falsely elevated due to recent DOAC CBC: Hgb slightly low/decreased - could be hemodilution. Plt WNL No bleeding or infusion related concerns reported by RN.  Goal of Therapy:  Heparin level 0.3-0.7 units/ml aPTT 66-102 seconds Monitor platelets by anticoagulation protocol: Yes   Plan:  Increase rate of heparin infusion to 1600 units/hr CBC, heparin level, aPTT daily Once heparin level and aPTT correlate, monitor using heparin level only Monitor for signs of bleeding **Pharmacist reached out to CCS team 6/4 and asked them to notify pharmacy of when to hold heparin prior to surgery once OR schedule determined.  Did not hear back from surgeon or PA.  Patient currently on OR schedule for 6/5 @ 1015. Discussed with RN and will hold starting at 0415 (6h pre-op) and attempt to contact surgery ~7a to clarify plan.   Arie Kurtz, PharmD 09/11/2023,12:21 AM

## 2023-09-11 NOTE — Progress Notes (Signed)
 [facility] ROUNDING NOTE GUILLAUME WENINGER GNF:[id number]  DOB: September 27, 1928  DOA: 09/09/2023  PCP: Tonna Frederic, MD  09/11/2023,1:23 PM  LOS: 1 day    Code Status: Full code   from: Home current Dispo: Unclear   89 year old male home dwelling Known paroxysmal A-fib CHA2DS2-VASc >4 on Eliquis  PTA, HTN, HLD Went to PCP 6/three 3-week history of fatigue SOB DOE anorexia-at baseline sleeps on 1 pillow but has become more short of breath on lying down Lab work showed elevated WBC LFT  Directed to ED 6/4 WBC 12.6 hemoglobin 14.2 platelet 251 AST/ALT 79/6.4 alk phos 141 bilirubin 1 potassium 2.9 BUN/creatinine 18/1.1 CT ABD pelvis acute cholecystitis multiple calcified gallstones including stone gallbladder neck moderate gallbladder wall thickening pericholecystic inflammation, 6 mm right pulmonary nodule General Surgery consulted Had some pauses  Plan  Acute cholecystitis Defer planning to general surgery-Eliquis  on hold Pain controllmorphine 1-2 every 4 as needed Continue Zosyn for intra-abdominal coverage  Paroxysmal A-fib CHADVASC >4 Pauses up to 3 seconds Eliquis  held heparin GTT Metoprolol  adjusted from 50-25 twice daily he is asymptomatic  HTN continue lisinopril  40, amlodipine  10  Severe hypokalemia on admission, Continue NS with 40 of K at 100 cc/H, magnesium 1.8 hold replacement   DVT prophylaxis: on IV heparin  Status is: Observation The patient will require care spanning > 2 midnights and should be moved to inpatient because:   Probably require surgery      Subjective: Looks well overall pain is controlled-some pauses on monitors-no symptoms no dizziness Looks comfortable  Objective + exam Vitals:   09/10/23 1423 09/10/23 1833 09/10/23 2156 09/11/23 0635  BP: (!) 172/73 (!) 155/78 (!) 154/91 (!) 151/77  Pulse: 83 85 75 81  Resp: 16 18 18 18   Temp: 98.4 F (36.9 C) 98 F (36.7 C) 98 F (36.7 C) 98 F (36.7 C)  TempSrc: Oral Oral Oral Oral  SpO2: 97% 97% 96%  97%  Weight:      Height:       Filed Weights   09/09/23 1812  Weight: 87.1 kg    Examination:  EOMI NCAT no focal deficit no icterus no pallor S1-S2 no murmur sinus, sinus arrhythmia with pauses little less than 3 seconds Abdominal obese nontender umbilical hernia noted slight epigastric tenderness no lower extremity edema neuro intact looks younger than stated age  Data Reviewed: reviewed   CBC    Component Value Date/Time   WBC 8.6 09/11/2023 0450   RBC 4.34 09/11/2023 0450   HGB 13.8 09/11/2023 0450   HGB 16.5 02/13/2023 1049   HCT 41.8 09/11/2023 0450   HCT 50.5 02/13/2023 1049   PLT 237 09/11/2023 0450   PLT 207 02/13/2023 1049   MCV 96.3 09/11/2023 0450   MCV 96 02/13/2023 1049   MCH 31.8 09/11/2023 0450   MCHC 33.0 09/11/2023 0450   RDW 13.3 09/11/2023 0450   RDW 12.5 02/13/2023 1049   LYMPHSABS 1.4 09/11/2023 0450   MONOABS 1.0 09/11/2023 0450   EOSABS 0.3 09/11/2023 0450   BASOSABS 0.1 09/11/2023 0450      Latest Ref Rng & Units 09/11/2023    4:50 AM 09/10/2023    4:37 AM 09/09/2023    2:25 PM  CMP  Glucose 70 - 99 mg/dL 846  962  952   BUN 8 - 23 mg/dL 5  14  18    Creatinine 0.61 - 1.24 mg/dL <8.41  3.24  4.01   Sodium 135 - 145 mmol/L 136  132  135  Potassium 3.5 - 5.1 mmol/L 2.8  4.6  2.9   Chloride 98 - 111 mmol/L 101  98  95   CO2 22 - 32 mmol/L 25  25  32   Calcium 8.9 - 10.3 mg/dL 8.9  8.4  9.1   Total Protein 6.5 - 8.1 g/dL  5.2  6.2   Total Bilirubin 0.0 - 1.2 mg/dL  1.2  1.0   Alkaline Phos 38 - 126 U/L  82  141   AST 15 - 41 U/L  49  79   ALT 0 - 44 U/L  46  64     Scheduled Meds:  [MAR Hold] amLODipine   10 mg Oral Daily   [MAR Hold] lisinopril   40 mg Oral Daily   [MAR Hold] metoprolol  tartrate  25 mg Oral BID   [MAR Hold] timolol  1 drop Both Eyes Daily   Continuous Infusions:  0.9 % NaCl with KCl 40 mEq / L 100 mL/hr at 09/11/23 0851   [MAR Hold] piperacillin-tazobactam (ZOSYN)  IV Stopped (09/11/23 1246)   [MAR Hold] potassium  chloride 10 mEq (09/11/23 1159)    Time  23  Verlie Glisson, MD  [facility]

## 2023-09-11 NOTE — Anesthesia Procedure Notes (Signed)
 Procedure Name: Intubation Date/Time: 09/11/2023 2:25 PM  Performed by: Jinny Mounts, CRNAPre-anesthesia Checklist: Patient identified, Emergency Drugs available, Suction available and Patient being monitored Patient Re-evaluated:Patient Re-evaluated prior to induction Oxygen Delivery Method: Circle System Utilized Preoxygenation: Pre-oxygenation with 100% oxygen Induction Type: IV induction Ventilation: Mask ventilation without difficulty Laryngoscope Size: Mac and 4 Grade View: Grade I Tube type: Oral Number of attempts: 1 Airway Equipment and Method: Stylet and Oral airway Placement Confirmation: ETT inserted through vocal cords under direct vision, positive ETCO2 and breath sounds checked- equal and bilateral Secured at: 22 cm Tube secured with: Tape Dental Injury: Teeth and Oropharynx as per pre-operative assessment

## 2023-09-11 NOTE — Progress Notes (Addendum)
 Gave report to Camilo Cella, RN in short stay. During that time tele called again to report longer rhythm pause totaling almost 3 seconds.   Haywood Lisle, MD and CCS PA Bambi Lever alerted.  Tele monitor remains in place. VSS. Patient in NAD.

## 2023-09-11 NOTE — Interval H&P Note (Signed)
 History and Physical Interval Note:  09/11/2023 1:52 PM  Philip Donaldson  has presented today for surgery, with the diagnosis of cholecystitis.  The various methods of treatment have been discussed with the patient and family. After consideration of risks, benefits and other options for treatment, the patient has consented to  Procedure(s): LAPAROSCOPIC CHOLECYSTECTOMY (N/A) INDOCYANINE GREEN FLUORESCENCE IMAGING (ICG) (N/A) as a surgical intervention.  The patient's history has been reviewed, patient examined, no change in status, stable for surgery.  I have reviewed the patient's chart and labs.  Questions were answered to the patient's satisfaction.     Lockie Rima

## 2023-09-11 NOTE — Transfer of Care (Signed)
 Immediate Anesthesia Transfer of Care Note  Patient: Philip Donaldson  Procedure(s) Performed: LAPAROSCOPIC CHOLECYSTECTOMY INDOCYANINE GREEN FLUORESCENCE IMAGING (ICG)  Patient Location: PACU  Anesthesia Type:General  Level of Consciousness: drowsy  Airway & Oxygen Therapy: Patient Spontanous Breathing and Patient connected to nasal cannula oxygen  Post-op Assessment: Report given to RN and Post -op Vital signs reviewed and stable  Post vital signs: Reviewed and stable  Last Vitals:  Vitals Value Taken Time  BP 99/51 09/11/23 1648  Temp    Pulse 72 09/11/23 1658  Resp 12 09/11/23 1658  SpO2 93 % 09/11/23 1658  Vitals shown include unfiled device data.  Last Pain:  Vitals:   09/11/23 1150  TempSrc:   PainSc: 0-No pain         Complications: No notable events documented.

## 2023-09-11 NOTE — Progress Notes (Signed)
 PHARMACY - ANTICOAGULATION CONSULT NOTE  Pharmacy Consult for heparin Indication: afib (apixaban  on hold)  Allergies  Allergen Reactions   Clarithromycin     REACTION: dizziness   Indomethacin     Other Reaction(s): Unknown   Pneumococcal Vaccines     Local swelling at injection site that resolved after a few days.  Likely Arthus reaction   Tuberculin Tests     Pt unsure- just told not to take it again    Patient Measurements: Height: 5\' 6"  (167.6 cm) Weight: 87.1 kg (192 lb) IBW/kg (Calculated) : 63.8 HEPARIN DW (KG): 82  Vital Signs: Temp: 98 F (36.7 C) (06/05 0635) Temp Source: Oral (06/05 0635) BP: 151/77 (06/05 0635) Pulse Rate: 81 (06/05 0635)  Labs: Recent Labs    09/09/23 1425 09/10/23 0437 09/10/23 1237 09/10/23 2326 09/11/23 0450  HGB 14.2 12.8*  --   --  13.8  HCT 42.4 39.9  --   --  41.8  PLT 251.0 217  --   --  237  APTT  --   --  58* 57*  --   HEPARINUNFRC  --   --  1.00*  --   --   CREATININE 1.11 0.55*  --   --  <0.30*    CrCl cannot be calculated (This lab value cannot be used to calculate CrCl because it is not a number: <0.30).   Medical History: 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   Cancer (HCC)    skin Ca-melanoma- scalp   Chronic kidney disease, stage 3 unspecified (HCC) 05/29/2022   Diverticulitis    Elevated LDL cholesterol level 04/06/2021   Essential hypertension 01/31/2020   Gout 02/02/2012   History of gout 01/31/2020   Hypercholesterolemia 05/29/2022   Hyperlipidemia    Hypertension    Mass of arm, left 01/31/2020   New onset atrial fibrillation (HCC) 06/04/2022   Pain of right hand 04/29/2022   Tubular adenoma of colon 05/10/2011    Medications: Apixaban  5 mg PO BID PTA -Last dose reported taken 6/3 AM  Assessment: Pt is a 40 yoM presenting with abdominal pain. CCS consulted, planning surgical intervention for acute cholecystitis. Pharmacy consulted to dose heparin while apixaban  on  hold for surgery.  Heparin was held this morning, and CCS would like it to resume at 23:00 on 6/5.   Goal of Therapy:  Heparin level 0.3-0.7 units/ml aPTT 66-102 seconds Monitor platelets by anticoagulation protocol: Yes   Plan:  No bolus per MD Resume heparin IV infusion at 1600 units/hr at 23:00  APTT 8 hours after starting CBC, heparin level, aPTT daily - Once heparin level and aPTT correlate, monitor using heparin level only Monitor for signs of bleeding   Kendall Pauls PharmD, BCPS [facility] main pharmacy [phone] 09/11/2023 4:40 PM

## 2023-09-11 NOTE — Plan of Care (Signed)

## 2023-09-11 NOTE — Progress Notes (Signed)
 Alerted by central tele pt had several rhythm pauses at 0600 and a single episode at 0900, each resulting in a 2 second pause.  [facility] MD alerted. Maczis, CCS PA at nurses station also alerted. Pt remains on telemetry with heparin drip paused at 0430 for possible surgery today.

## 2023-09-12 ENCOUNTER — Encounter (HOSPITAL_COMMUNITY): Payer: Self-pay | Admitting: General Surgery

## 2023-09-12 DIAGNOSIS — K810 Acute cholecystitis: Secondary | ICD-10-CM | POA: Diagnosis not present

## 2023-09-12 LAB — BASIC METABOLIC PANEL WITH GFR
Anion gap: 9 (ref 5–15)
BUN: 12 mg/dL (ref 8–23)
CO2: 23 mmol/L (ref 22–32)
Calcium: 8.5 mg/dL — ABNORMAL LOW (ref 8.9–10.3)
Chloride: 104 mmol/L (ref 98–111)
Creatinine, Ser: 1.09 mg/dL (ref 0.61–1.24)
GFR, Estimated: >60 mL/min (ref >60)
Glucose, Bld: 129 mg/dL — ABNORMAL HIGH (ref 70–99)
Potassium: 4.4 mmol/L (ref 3.5–5.1)
Sodium: 136 mmol/L (ref 135–145)

## 2023-09-12 LAB — CBC WITH DIFFERENTIAL/PLATELET
Abs Immature Granulocytes: 0.16 10*3/uL — ABNORMAL HIGH (ref 0.00–0.07)
Basophils Absolute: 0 10*3/uL (ref 0.0–0.1)
Basophils Relative: 0 %
Eosinophils Absolute: 0 10*3/uL (ref 0.0–0.5)
Eosinophils Relative: 0 %
HCT: 37.9 % — ABNORMAL LOW (ref 39.0–52.0)
Hemoglobin: 12.2 g/dL — ABNORMAL LOW (ref 13.0–17.0)
Immature Granulocytes: 1 %
Lymphocytes Relative: 6 %
Lymphs Abs: 0.8 10*3/uL (ref 0.7–4.0)
MCH: 31.2 pg (ref 26.0–34.0)
MCHC: 32.2 g/dL (ref 30.0–36.0)
MCV: 96.9 fL (ref 80.0–100.0)
Monocytes Absolute: 0.8 10*3/uL (ref 0.1–1.0)
Monocytes Relative: 6 %
Neutro Abs: 11.4 10*3/uL — ABNORMAL HIGH (ref 1.7–7.7)
Neutrophils Relative %: 87 %
Platelets: 276 10*3/uL (ref 150–400)
RBC: 3.91 MIL/uL — ABNORMAL LOW (ref 4.22–5.81)
RDW: 13.7 % (ref 11.5–15.5)
WBC: 13.2 10*3/uL — ABNORMAL HIGH (ref 4.0–10.5)
nRBC: 0 % (ref 0.0–0.2)

## 2023-09-12 LAB — HEPARIN LEVEL (UNFRACTIONATED)
Heparin Unfractionated: 0.55 [IU]/mL (ref 0.30–0.70)
Heparin Unfractionated: 0.5 [IU]/mL (ref 0.30–0.70)

## 2023-09-12 LAB — HEPATIC FUNCTION PANEL
ALT: 71 U/L — ABNORMAL HIGH (ref 0–44)
AST: 104 U/L — ABNORMAL HIGH (ref 15–41)
Albumin: 2.8 g/dL — ABNORMAL LOW (ref 3.5–5.0)
Alkaline Phosphatase: 88 U/L (ref 38–126)
Bilirubin, Direct: 0.2 mg/dL (ref 0.0–0.2)
Indirect Bilirubin: 0.7 mg/dL (ref 0.3–0.9)
Total Bilirubin: 0.9 mg/dL (ref 0.0–1.2)
Total Protein: 6.4 g/dL — ABNORMAL LOW (ref 6.5–8.1)

## 2023-09-12 LAB — APTT: aPTT: 82 s — ABNORMAL HIGH (ref 24–36)

## 2023-09-12 MED ORDER — OXYCODONE HCL 5 MG PO TABS
5.0000 mg | ORAL_TABLET | Freq: Four times a day (QID) | ORAL | Status: DC | PRN
  Filled 2023-09-12: qty 1

## 2023-09-12 NOTE — Progress Notes (Signed)
 PHARMACY - ANTICOAGULATION CONSULT NOTE  Pharmacy Consult for heparin Indication: afib (apixaban  on hold)  Allergies  Allergen Reactions   Clarithromycin     REACTION: dizziness   Indomethacin     Other Reaction(s): Unknown   Pneumococcal Vaccines     Local swelling at injection site that resolved after a few days.  Likely Arthus reaction   Tuberculin Tests     Pt unsure- just told not to take it again    Patient Measurements: Height: 5\' 6"  (167.6 cm) Weight: 87.1 kg (192 lb) IBW/kg (Calculated) : 63.8 HEPARIN DW (KG): 82  Vital Signs: Temp: 98.4 F (36.9 C) (06/06 1344) Temp Source: Oral (06/06 1344) BP: 129/70 (06/06 1344) Pulse Rate: 65 (06/06 1344)  Labs: Recent Labs    09/10/23 0437 09/10/23 1237 09/10/23 2326 09/11/23 0450 09/12/23 0417 09/12/23 0755 09/12/23 1642  HGB 12.8*  --   --  13.8 12.2*  --   --   HCT 39.9  --   --  41.8 37.9*  --   --   PLT 217  --   --  237 276  --   --   APTT  --  58* 57*  --   --  82*  --   HEPARINUNFRC  --  1.00*  --   --   --  0.55 0.50  CREATININE 0.55*  --   --  <0.30*  --  1.09  --     Estimated Creatinine Clearance: 43.8 mL/min (by C-G formula based on SCr of 1.09 mg/dL).  Medications: Apixaban  5 mg PO BID PTA -Last dose reported taken 6/3 AM  Assessment: Pt is a 89 yoM presenting with abdominal pain. CCS consulted, planning surgical intervention for acute cholecystitis. Pharmacy consulted to dose heparin while apixaban  on hold for surgery.    Pt underwent lap chole 6/5, UFH resumed post-op per CCS orders. No bolus.  Today, 09/12/23 Confirmatory heparin level remains therapeutic on 1600 units/hr CBC: Hgb slightly low, Plt WNL RN noted minimal bleeding around IV site earlier today, relieved with pressure. No issues from new IV site. JP drain with normal output  Goal of Therapy:  Heparin level 0.3-0.7 units/ml Monitor platelets by anticoagulation protocol: Yes   Plan:  No bolus per CCS Continue heparin  infusion at 1600 units/hr CBC, heparin level daily Monitor for signs of bleeding  Brycin Kille A, PharmD 09/12/23 5:52 PM

## 2023-09-12 NOTE — Progress Notes (Signed)
 PHARMACY - ANTICOAGULATION CONSULT NOTE  Pharmacy Consult for heparin Indication: afib (apixaban  on hold)  Allergies  Allergen Reactions   Clarithromycin     REACTION: dizziness   Indomethacin     Other Reaction(s): Unknown   Pneumococcal Vaccines     Local swelling at injection site that resolved after a few days.  Likely Arthus reaction   Tuberculin Tests     Pt unsure- just told not to take it again    Patient Measurements: Height: 5\' 6"  (167.6 cm) Weight: 87.1 kg (192 lb) IBW/kg (Calculated) : 63.8 HEPARIN DW (KG): 82  Vital Signs: Temp: 97.8 F (36.6 C) (06/06 0919) Temp Source: Oral (06/06 0919) BP: 156/68 (06/06 0919) Pulse Rate: 91 (06/06 0919)  Labs: Recent Labs    09/10/23 0437 09/10/23 1237 09/10/23 2326 09/11/23 0450 09/12/23 0417 09/12/23 0755  HGB 12.8*  --   --  13.8 12.2*  --   HCT 39.9  --   --  41.8 37.9*  --   PLT 217  --   --  237 276  --   APTT  --  58* 57*  --   --  82*  HEPARINUNFRC  --  1.00*  --   --   --  0.55  CREATININE 0.55*  --   --  <0.30*  --  1.09    Estimated Creatinine Clearance: 43.8 mL/min (by C-G formula based on SCr of 1.09 mg/dL).   Medical History: 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   Cancer (HCC)    skin Ca-melanoma- scalp   Chronic kidney disease, stage 3 unspecified (HCC) 05/29/2022   Diverticulitis    Elevated LDL cholesterol level 04/06/2021   Essential hypertension 01/31/2020   Gout 02/02/2012   History of gout 01/31/2020   Hypercholesterolemia 05/29/2022   Hyperlipidemia    Hypertension    Mass of arm, left 01/31/2020   New onset atrial fibrillation (HCC) 06/04/2022   Pain of right hand 04/29/2022   Tubular adenoma of colon 05/10/2011    Medications: Apixaban  5 mg PO BID PTA -Last dose reported taken 6/3 AM  Assessment: Pt is a 14 yoM presenting with abdominal pain. CCS consulted, planning surgical intervention for acute cholecystitis. Pharmacy consulted to dose  heparin while apixaban  on hold for surgery.    Pt underwent lap chole 6/5, UFH resumed post-op per CCS orders. No bolus.  Today, 09/12/23 Heparin level = 0.55, aPTT = 82 seconds both therapeutic on heparin infusion of 1600 units/hr CBC: Hgb slightly low, Plt WNL RN reports minimal bleeding around IV site, relieved with pressure. JP drain with normal output. No overt signs of bleeding.   Now that heparin level and aPTT correlating within goal range, will monitor using heparin level only.   Goal of Therapy:  Heparin level 0.3-0.7 units/ml aPTT 66-102 seconds Monitor platelets by anticoagulation protocol: Yes   Plan:  No bolus per CCS Continue heparin infusion at 1600 units/hr Check confirmatory 8 hour heparin level CBC, heparin level daily Monitor for signs of bleeding  Shireen Dory, PharmD 09/12/23 10:22 AM

## 2023-09-12 NOTE — Plan of Care (Signed)

## 2023-09-12 NOTE — Progress Notes (Signed)
 [facility] ROUNDING NOTE Philip Donaldson ZOX:[id number]  DOB: July 11, 1928  DOA: 09/09/2023  PCP: Tonna Frederic, MD  09/12/2023,11:23 AM  LOS: 2 days    Code Status: Full code   from: Home current Dispo: Unclear   89 year old male home dwelling Known paroxysmal A-fib CHA2DS2-VASc >4 on Eliquis  PTA, HTN, HLD Went to PCP 6/three 3-week history of fatigue SOB DOE anorexia-at baseline sleeps on 1 pillow but has become more short of breath on lying down Lab work showed elevated WBC LFT  Directed to ED 6/4 WBC 12.6 hemoglobin 14.2 platelet 251 AST/ALT 79/6.4 alk phos 141 bilirubin 1 potassium 2.9 BUN/creatinine 18/1.1 CT ABD pelvis acute cholecystitis multiple calcified gallstones including stone gallbladder neck moderate gallbladder wall thickening pericholecystic inflammation, 6 mm right pulmonary nodule General Surgery consulted Had some pauses  Status post lap chole 09/11/2022  Plan  Acute cholecystitis Status post lap chole 09/11/2022 Continue Zosyn perioperatively as per general surgery Continue scheduled Tylenol 1000 every 8, discontinue IV morphine placed on oxycodone 5 mg every 6 as needed severe pain >7/10 DC all IV fluid--LFTs slightly up today possibly secondary to instrumentation etc. monitor trends Continues on heparin gtt.-can convert once general surgery feels it is okay to do so WBC elevation is likely reactive but continue the antibiotics  Paroxysmal A-fib CHADVASC >4 Pauses up to 3 seconds seem to have resolved with lower dose of metoprolol  Eliquis  to be resumed as above-rate control with metoprolol  25 twice daily  HTN continue lisinopril  40, amlodipine  10  Severe hypokalemia on admission, Replaced with IV and now resolved Periodic lab checks   DVT prophylaxis: on IV heparin  Status is: Observation The patient will require care spanning > 2 midnights and should be moved to inpatient because:   Probably require surgery    Subjective:  Pain moderate about a  3-4/10 Some flatus-was able to eat  Objective + exam Vitals:   09/11/23 2244 09/12/23 0203 09/12/23 0543 09/12/23 0919  BP: (!) 143/78 (!) 148/76 (!) 140/71 (!) 156/68  Pulse: 99 77 94 91  Resp: 20 20 20 17   Temp: 97.8 F (36.6 C) 97.8 F (36.6 C) 98 F (36.7 C) 97.8 F (36.6 C)  TempSrc: Oral Oral Oral Oral  SpO2: 95% 95% 98% 95%  Weight:      Height:       Filed Weights   09/09/23 1812  Weight: 87.1 kg    Examination:  EOMI NCAT no focal deficit no icterus no pallor S1-S2 no murmur sinus, sinus arrhythmia with pauses little less than 3 seconds Abdominal obese nontender umbilical hernia noted slight epigastric tenderness no lower extremity edema neuro intact looks younger than stated age  Data Reviewed: reviewed   CBC    Component Value Date/Time   WBC 13.2 (H) 09/12/2023 0417   RBC 3.91 (L) 09/12/2023 0417   HGB 12.2 (L) 09/12/2023 0417   HGB 16.5 02/13/2023 1049   HCT 37.9 (L) 09/12/2023 0417   HCT 50.5 02/13/2023 1049   PLT 276 09/12/2023 0417   PLT 207 02/13/2023 1049   MCV 96.9 09/12/2023 0417   MCV 96 02/13/2023 1049   MCH 31.2 09/12/2023 0417   MCHC 32.2 09/12/2023 0417   RDW 13.7 09/12/2023 0417   RDW 12.5 02/13/2023 1049   LYMPHSABS 0.8 09/12/2023 0417   MONOABS 0.8 09/12/2023 0417   EOSABS 0.0 09/12/2023 0417   BASOSABS 0.0 09/12/2023 0417      Latest Ref Rng & Units 09/12/2023    7:55 AM  09/11/2023    4:50 AM 09/10/2023    4:37 AM  CMP  Glucose 70 - 99 mg/dL 161  096  045   BUN 8 - 23 mg/dL 12  <5  14   Creatinine 0.61 - 1.24 mg/dL 4.09  <8.11  9.14   Sodium 135 - 145 mmol/L 136  136  132   Potassium 3.5 - 5.1 mmol/L 4.4  2.8  4.6   Chloride 98 - 111 mmol/L 104  101  98   CO2 22 - 32 mmol/L 23  25  25    Calcium 8.9 - 10.3 mg/dL 8.5  8.9  8.4   Total Protein 6.5 - 8.1 g/dL 6.4   5.2   Total Bilirubin 0.0 - 1.2 mg/dL 0.9   1.2   Alkaline Phos 38 - 126 U/L 88   82   AST 15 - 41 U/L 104   49   ALT 0 - 44 U/L 71   46     Scheduled Meds:   acetaminophen  1,000 mg Oral Q8H   amLODipine   10 mg Oral Daily   lisinopril   40 mg Oral Daily   metoprolol  tartrate  25 mg Oral BID   timolol  1 drop Both Eyes Daily   Continuous Infusions:  heparin 1,600 Units/hr (09/11/23 2237)   piperacillin-tazobactam (ZOSYN)  IV 3.375 g (09/12/23 1117)    Time  23  Philip Glisson, MD  [facility]

## 2023-09-12 NOTE — Progress Notes (Signed)
 [facility] Progress Note  1 Day Post-Op  Subjective: CC:  Feels much better. Eating breakfast. Still a little SOB. No vomiting.  Objective: Vital signs in last 24 hours: Temp:  [97.3 F (36.3 C)-98 F (36.7 C)] 97.8 F (36.6 C) (06/06 0919) Pulse Rate:  [47-99] 91 (06/06 0919) Resp:  [10-24] 17 (06/06 0919) BP: (99-156)/(51-100) 156/68 (06/06 0919) SpO2:  [90 %-100 %] 95 % (06/06 0919) Last BM Date : 09/11/23  Intake/Output from previous day: 06/05 0701 - 06/06 0700 In: 3705.3 [P.O.:930; I.V.:2331.1; IV Piggyback:444.2] Out: 640 [Urine:350; Drains:190; Blood:100] Intake/Output this shift: No intake/output data recorded.  PE: Gen:  Alert, NAD, pleasant Card:  Regular rate and rhythm Pulm:  Normal effort Abd: Soft, minimally tender around incision, abdomen is protuberant and firm, incisions c/d/I with some ecchymosis of the umbilicus where a hernia was repaired primarily, drain SS. Skin: warm and dry, no rashes  Psych: A&Ox3   Lab Results:  Recent Labs    09/11/23 0450 09/12/23 0417  WBC 8.6 13.2*  HGB 13.8 12.2*  HCT 41.8 37.9*  PLT 237 276   BMET Recent Labs    09/11/23 0450 09/12/23 0755  NA 136 136  K 2.8* 4.4  CL 101 104  CO2 25 23  GLUCOSE 105* 129*  BUN <5* 12  CREATININE <0.30* 1.09  CALCIUM 8.9 8.5*   PT/INR No results for input(s): "LABPROT", "INR" in the last 72 hours. CMP     Component Value Date/Time   NA 136 09/12/2023 0755   NA 140 02/13/2023 1049   K 4.4 09/12/2023 0755   CL 104 09/12/2023 0755   CO2 23 09/12/2023 0755   GLUCOSE 129 (H) 09/12/2023 0755   BUN 12 09/12/2023 0755   BUN 16 02/13/2023 1049   CREATININE 1.09 09/12/2023 0755   CALCIUM 8.5 (L) 09/12/2023 0755   PROT 6.4 (L) 09/12/2023 0755   PROT 7.3 02/13/2023 1049   ALBUMIN 2.8 (L) 09/12/2023 0755   ALBUMIN 4.3 02/13/2023 1049   AST 104 (H) 09/12/2023 0755   ALT 71 (H) 09/12/2023 0755   ALKPHOS 88 09/12/2023 0755   BILITOT 0.9 09/12/2023 0755    BILITOT 0.8 02/13/2023 1049   GFRNONAA >60 09/12/2023 0755   GFRAA  10/17/2009 0047    >60        The eGFR has been calculated using the MDRD equation. This calculation has not been validated in all clinical situations. eGFR's persistently <60 mL/min signify possible Chronic Kidney Disease.   Lipase     Component Value Date/Time   LIPASE 49 09/09/2023 1837       Studies/Results: No results found.  Anti-infectives: Anti-infectives (From admission, onward)    Start     Dose/Rate Route Frequency Ordered Stop   09/10/23 0800  piperacillin-tazobactam (ZOSYN) IVPB 3.375 g        3.375 g 12.5 mL/hr over 240 Minutes Intravenous Every 8 hours 09/10/23 0234     09/09/23 2300  piperacillin-tazobactam (ZOSYN) IVPB 3.375 g        3.375 g 100 mL/hr over 30 Minutes Intravenous  Once 09/09/23 2253 09/10/23 0742        Assessment/Plan  Acute calculous cholecystitis POD#1 s/p lap cholecystectomy, drain placement - AFVSS, WBC 13 from 8, hepatic function panel pending  - soft diet - hep gtt  - Zosyn, would recommend a total of 3 days antibiotics post-op.   LOS: 2 days   I reviewed nursing notes, hospitalist notes, last 24 h vitals  and pain scores, last 48 h intake and output, last 24 h labs and trends, and last 24 h imaging results.    Michial Akin, PA-C [facility] Please see Amion for pager number during day hours 7:00am-4:30pm

## 2023-09-12 NOTE — Progress Notes (Signed)
 Mobility Specialist - Progress Note   09/12/23 1324  Oxygen Therapy  O2 Device Nasal Cannula  O2 Flow Rate (L/min) 2 L/min  Mobility  Activity Ambulated with assistance in hallway  Level of Assistance Modified independent, requires aide device or extra time  Assistive Device [employer] Ambulated (ft) 275 ft  Activity Response Tolerated well  Mobility Referral Yes  Mobility visit 1 Mobility  Mobility Specialist Start Time (ACUTE ONLY) 1308  Mobility Specialist Stop Time (ACUTE ONLY) 1323  Mobility Specialist Time Calculation (min) (ACUTE ONLY) 15 min   Pt received in recliner and agreeable to mobility. No complaints during session. Dyspnea with exertion. Pt to recliner after session with all needs met.    [facility]

## 2023-09-13 DIAGNOSIS — K810 Acute cholecystitis: Secondary | ICD-10-CM | POA: Diagnosis not present

## 2023-09-13 LAB — BASIC METABOLIC PANEL WITH GFR
Anion gap: 8 (ref 5–15)
BUN: 13 mg/dL (ref 8–23)
CO2: 26 mmol/L (ref 22–32)
Calcium: 8.7 mg/dL — ABNORMAL LOW (ref 8.9–10.3)
Chloride: 103 mmol/L (ref 98–111)
Creatinine, Ser: 1.14 mg/dL (ref 0.61–1.24)
GFR, Estimated: 60 mL/min — ABNORMAL LOW (ref >60)
Glucose, Bld: 113 mg/dL — ABNORMAL HIGH (ref 70–99)
Potassium: 3.5 mmol/L (ref 3.5–5.1)
Sodium: 137 mmol/L (ref 135–145)

## 2023-09-13 LAB — CBC WITH DIFFERENTIAL/PLATELET
Abs Immature Granulocytes: 0.19 10*3/uL — ABNORMAL HIGH (ref 0.00–0.07)
Basophils Absolute: 0.1 10*3/uL (ref 0.0–0.1)
Basophils Relative: 1 %
Eosinophils Absolute: 0.2 10*3/uL (ref 0.0–0.5)
Eosinophils Relative: 2 %
HCT: 40.9 % (ref 39.0–52.0)
Hemoglobin: 12.9 g/dL — ABNORMAL LOW (ref 13.0–17.0)
Immature Granulocytes: 2 %
Lymphocytes Relative: 15 %
Lymphs Abs: 1.8 10*3/uL (ref 0.7–4.0)
MCH: 30.9 pg (ref 26.0–34.0)
MCHC: 31.5 g/dL (ref 30.0–36.0)
MCV: 97.8 fL (ref 80.0–100.0)
Monocytes Absolute: 1.2 10*3/uL — ABNORMAL HIGH (ref 0.1–1.0)
Monocytes Relative: 10 %
Neutro Abs: 8.8 10*3/uL — ABNORMAL HIGH (ref 1.7–7.7)
Neutrophils Relative %: 70 %
Platelets: 268 10*3/uL (ref 150–400)
RBC: 4.18 MIL/uL — ABNORMAL LOW (ref 4.22–5.81)
RDW: 13.8 % (ref 11.5–15.5)
WBC: 12.3 10*3/uL — ABNORMAL HIGH (ref 4.0–10.5)
nRBC: 0 % (ref 0.0–0.2)

## 2023-09-13 LAB — HEPARIN LEVEL (UNFRACTIONATED): Heparin Unfractionated: 0.45 [IU]/mL (ref 0.30–0.70)

## 2023-09-13 MED ORDER — APIXABAN 5 MG PO TABS
5.0000 mg | ORAL_TABLET | Freq: Two times a day (BID) | ORAL | Status: DC
  Administered 2023-09-13 – 2023-09-14 (×3): 5 mg via ORAL
  Filled 2023-09-13 (×3): qty 1

## 2023-09-13 MED ORDER — ALBUTEROL SULFATE HFA 108 (90 BASE) MCG/ACT IN AERS
2.0000 | INHALATION_SPRAY | Freq: Two times a day (BID) | RESPIRATORY_TRACT | Status: DC
  Administered 2023-09-13 – 2023-09-14 (×3): 2 via RESPIRATORY_TRACT
  Filled 2023-09-13: qty 6.7

## 2023-09-13 MED ORDER — AMOXICILLIN-POT CLAVULANATE 875-125 MG PO TABS
1.0000 | ORAL_TABLET | Freq: Two times a day (BID) | ORAL | Status: DC
  Administered 2023-09-13 – 2023-09-14 (×2): 1 via ORAL
  Filled 2023-09-13 (×2): qty 1

## 2023-09-13 NOTE — Progress Notes (Signed)
 2 Days Post-Op   Subjective/Chief Complaint: Complains of abd tenderness. Feels bloated   Objective: Vital signs in last 24 hours: Temp:  [98.4 F (36.9 C)-98.5 F (36.9 C)] 98.4 F (36.9 C) (06/07 0615) Pulse Rate:  [65-92] 77 (06/07 0615) Resp:  [17-18] 18 (06/07 0615) BP: (129-160)/(70-86) 160/86 (06/07 0615) SpO2:  [94 %-99 %] 95 % (06/07 0633) Last BM Date : 09/11/23  Intake/Output from previous day: 06/06 0701 - 06/07 0700 In: 742.6 [P.O.:150; I.V.:388.2; IV Piggyback:204.5] Out: 300 [Drains:300] Intake/Output this shift: Total I/O In: 480 [P.O.:480] Out: 30 [Drains:30]  General appearance: alert and cooperative Resp: clear to auscultation bilaterally Cardio: regular rate and rhythm GI: soft, mild tenderness. Drain output serosanguinous  Lab Results:  Recent Labs    09/12/23 0417 09/13/23 0507  WBC 13.2* 12.3*  HGB 12.2* 12.9*  HCT 37.9* 40.9  PLT 276 268   BMET Recent Labs    09/12/23 0755 09/13/23 0507  NA 136 137  K 4.4 3.5  CL 104 103  CO2 23 26  GLUCOSE 129* 113*  BUN 12 13  CREATININE 1.09 1.14  CALCIUM 8.5* 8.7*   PT/INR No results for input(s): "LABPROT", "INR" in the last 72 hours. ABG No results for input(s): "PHART", "HCO3" in the last 72 hours.  Invalid input(s): "PCO2", "PO2"  Studies/Results: No results found.  Anti-infectives: Anti-infectives (From admission, onward)    Start     Dose/Rate Route Frequency Ordered Stop   09/10/23 0800  piperacillin -tazobactam (ZOSYN ) IVPB 3.375 g        3.375 g 12.5 mL/hr over 240 Minutes Intravenous Every 8 hours 09/10/23 0234     09/09/23 2300  piperacillin -tazobactam (ZOSYN ) IVPB 3.375 g        3.375 g 100 mL/hr over 30 Minutes Intravenous  Once 09/09/23 2253 09/10/23 0742       Assessment/Plan: s/p Procedure(s): LAPAROSCOPIC CHOLECYSTECTOMY (N/A) INDOCYANINE GREEN  FLUORESCENCE IMAGING (ICG) (N/A) Advance diet Ambulate Monitor drain output Hopefully ready for d/c tomorrow   LOS: 3 days    Lillette Reid III 09/13/2023

## 2023-09-13 NOTE — Progress Notes (Signed)
 PHARMACY - ANTICOAGULATION CONSULT NOTE  Pharmacy Consult for heparin  Indication: afib (apixaban  on hold)  Allergies  Allergen Reactions   Clarithromycin     REACTION: dizziness   Indomethacin     Other Reaction(s): Unknown   Pneumococcal Vaccines     Local swelling at injection site that resolved after a few days.  Likely Arthus reaction   Tuberculin Tests     Pt unsure- just told not to take it again    Patient Measurements: Height: 5\' 6"  (167.6 cm) Weight: 87.1 kg (192 lb) IBW/kg (Calculated) : 63.8 HEPARIN  DW (KG): 82  Vital Signs: Temp: 98.4 F (36.9 C) (06/07 0615) Temp Source: Oral (06/07 0615) BP: 160/86 (06/07 0615) Pulse Rate: 77 (06/07 0615)  Labs: Recent Labs    09/10/23 1237 09/10/23 2326 09/11/23 0450 09/11/23 0450 09/12/23 0417 09/12/23 0755 09/12/23 1642 09/13/23 0507  HGB  --   --  13.8   < > 12.2*  --   --  12.9*  HCT  --   --  41.8  --  37.9*  --   --  40.9  PLT  --   --  237  --  276  --   --  268  APTT 58* 57*  --   --   --  82*  --   --   HEPARINUNFRC 1.00*  --   --   --   --  0.55 0.50 0.45  CREATININE  --   --  <0.30*  --   --  1.09  --  1.14   < > = values in this interval not displayed.    Estimated Creatinine Clearance: 41.9 mL/min (by C-G formula based on SCr of 1.14 mg/dL).   Medical History: 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   Cancer (HCC)    skin Ca-melanoma- scalp   Chronic kidney disease, stage 3 unspecified (HCC) 05/29/2022   Diverticulitis    Elevated LDL cholesterol level 04/06/2021   Essential hypertension 01/31/2020   Gout 02/02/2012   History of gout 01/31/2020   Hypercholesterolemia 05/29/2022   Hyperlipidemia    Hypertension    Mass of arm, left 01/31/2020   New onset atrial fibrillation (HCC) 06/04/2022   Pain of right hand 04/29/2022   Tubular adenoma of colon 05/10/2011    Medications: Apixaban  5 mg PO BID PTA -Last dose reported taken 6/3 AM  Assessment: Pt is a 67  yoM presenting with abdominal pain. CCS consulted, planning surgical intervention for acute cholecystitis. Pharmacy consulted to dose heparin  while apixaban  on hold for surgery.    Pt underwent lap chole 6/5, UFH resumed post-op per CCS orders. No bolus.  Today, 09/13/23 Heparin  level = 0.45 remains therapeutic on heparin  infusion of 1600 units/hr CBC: Hgb slightly low, Plt WNL No bleeding reported  Goal of Therapy:  Heparin  level 0.3-0.7 units/ml Monitor platelets by anticoagulation protocol: Yes   Plan:  No bolus per CCS Continue heparin  infusion at 1600 units/hr CBC, heparin  level daily Monitor for signs of bleeding  Shireen Dory, PharmD 09/13/23 8:36 AM

## 2023-09-13 NOTE — Progress Notes (Signed)
 Patient JP site soaked through bandage for second time this shift but JP drain patent and pulling moderate amounts of serosanguineous fluid.

## 2023-09-13 NOTE — Progress Notes (Signed)
 [facility] ROUNDING NOTE CUINN WESTERHOLD ZOX:[id number]  DOB: [birth year]  DOA: 09/09/2023  PCP: Tonna Frederic, MD  09/13/2023,1:28 PM  LOS: 3 days    Code Status: Full code   from: Home current Dispo: Unclear   89 year old male home dwelling Known paroxysmal A-fib CHA2DS2-VASc >4 on Eliquis  PTA, HTN, HLD Went to PCP 6/three 3-week history of fatigue SOB DOE anorexia-at baseline sleeps on 1 pillow but has become more short of breath on lying down Lab work showed elevated WBC LFT  Directed to ED 6/4 WBC 12.6 hemoglobin 14.2 platelet 251 AST/ALT 79/6.4 alk phos 141 bilirubin 1 potassium 2.9 BUN/creatinine 18/1.1 CT ABD pelvis acute cholecystitis multiple calcified gallstones including stone gallbladder neck moderate gallbladder wall thickening pericholecystic inflammation, 6 mm right pulmonary nodule General Surgery consulted Had some pauses  Status post lap chole 09/11/2022  Plan  Acute cholecystitis Status post lap chole 09/11/2022 Zosyn  to Augmeitn total 5 d [eot 6/8]--drain RUQ per surgery Continue scheduled Tylenol  1000 every 8, discontinue IV morphine  placed on oxycodone  5 mg every 6 as needed severe pain >7/10 Overall better  Paroxysmal A-fib CHADVASC >4 Pauses up to 3 seconds seem to have resolved with lower dose of metoprolol  Eliquis  5 bid resumed -rate control with metoprolol  25 twice daily  HTN continue lisinopril  40, amlodipine  10  Severe hypokalemia on admission, Replaced  now resolved  Discussed daily with family  DVT prophylaxis: on IV heparin   Status is: Observation The patient will require care spanning > 2 midnights and should be moved to inpatient because:   Probably require surgery    Subjective:  Looks well Pain controlled No fever  Objective + exam Vitals:   09/12/23 1344 09/12/23 2144 09/13/23 0615 09/13/23 0633  BP: 129/70 (!) 157/74 (!) 160/86   Pulse: 65 92 77   Resp: 17 17 18    Temp: 98.4 F (36.9 C) 98.5 F (36.9 C) 98.4 F (36.9 C)    TempSrc: Oral Oral Oral   SpO2: 99% 96% 94% 95%  Weight:      Height:       Filed Weights   09/09/23 1812  Weight: 87.1 kg    Examination:  EOMI NCAT no focal deficit no icterus no pallor S1-S2 no murmur sinus, mainly short pauses below 2 sec Abdominal obese nontender umbilical hernia noted slight epigastric tenderness--JP in place RUQ  no lower extremity edema neuro intact looks younger than stated age  Data Reviewed: reviewed   CBC    Component Value Date/Time   WBC 12.3 (H) 09/13/2023 0507   RBC 4.18 (L) 09/13/2023 0507   HGB 12.9 (L) 09/13/2023 0507   HGB 16.5 02/13/2023 1049   HCT 40.9 09/13/2023 0507   HCT 50.5 02/13/2023 1049   PLT 268 09/13/2023 0507   PLT 207 02/13/2023 1049   MCV 97.8 09/13/2023 0507   MCV 96 02/13/2023 1049   MCH 30.9 09/13/2023 0507   MCHC 31.5 09/13/2023 0507   RDW 13.8 09/13/2023 0507   RDW 12.5 02/13/2023 1049   LYMPHSABS 1.8 09/13/2023 0507   MONOABS 1.2 (H) 09/13/2023 0507   EOSABS 0.2 09/13/2023 0507   BASOSABS 0.1 09/13/2023 0507      Latest Ref Rng & Units 09/13/2023    5:07 AM 09/12/2023    7:55 AM 09/11/2023    4:50 AM  CMP  Glucose 70 - 99 mg/dL 811  914  782   BUN 8 - 23 mg/dL 13  12  <5   Creatinine 0.61 -  1.24 mg/dL 8.75  6.43  <3.29   Sodium 135 - 145 mmol/L 137  136  136   Potassium 3.5 - 5.1 mmol/L 3.5  4.4  2.8   Chloride 98 - 111 mmol/L 103  104  101   CO2 22 - 32 mmol/L 26  23  25    Calcium 8.9 - 10.3 mg/dL 8.7  8.5  8.9   Total Protein 6.5 - 8.1 g/dL  6.4    Total Bilirubin 0.0 - 1.2 mg/dL  0.9    Alkaline Phos 38 - 126 U/L  88    AST 15 - 41 U/L  104    ALT 0 - 44 U/L  71      Scheduled Meds:  acetaminophen   1,000 mg Oral Q8H   albuterol   2 puff Inhalation BID   amLODipine   10 mg Oral Daily   amoxicillin-clavulanate  1 tablet Oral Q12H   apixaban   5 mg Oral BID   lisinopril   40 mg Oral Daily   metoprolol  tartrate  25 mg Oral BID   timolol   1 drop Both Eyes Daily   Continuous Infusions:  Time   33  Jai-Gurmukh Joanne Salah, MD  [facility]

## 2023-09-13 NOTE — Plan of Care (Signed)
   Problem: Health Behavior/Discharge Planning: Goal: Ability to manage health-related needs will improve Outcome: Progressing   Problem: Clinical Measurements: Goal: Ability to maintain clinical measurements within normal limits will improve Outcome: Progressing Goal: Will remain free from infection Outcome: Progressing

## 2023-09-13 NOTE — Evaluation (Signed)
 Physical Therapy Evaluation Patient Details Name: Philip Donaldson MRN: [id number] DOB: 1929/04/07 Today's Date: 09/13/2023  History of Present Illness  89 y.o. male admitted abd pain x 1 month. ER CT abdomen pelvis shows features concerning for acute cholecystitis. pt underwent  laparoscopic cholecystectomy on 09/11/23. PMH: paroxysmal atrial fibrillation, hypertension, hyperlipidemia  Clinical Impression  Pt admitted with above diagnosis.  Pt reports independence at baseline, typically amb with cane, resides at [facility]. Pt presents weaker than his baseline, fatigues with amb and is unsteady without UE support. Pt did well with rollator trial - amb ~120', cues needed for safety and proper position of rollator; reviewed rollator features and safety with pt and dtr.  Pt currently with functional limitations due to the deficits listed below (see PT Problem List). Pt will benefit from acute skilled PT to increase their independence and safety with mobility to allow discharge.           If plan is discharge home, recommend the following: Help with stairs or ramp for entrance;Assist for transportation;Assistance with cooking/housework   Can travel by Nurse, mental health (4 wheels)  Recommendations for Other Services       Functional Status Assessment Patient has had a recent decline in their functional status and demonstrates the ability to make significant improvements in function in a reasonable and predictable amount of time.     Precautions / Restrictions Precautions Precautions: Fall Restrictions Weight Bearing Restrictions Per Provider Order: No      Mobility  Bed Mobility Overal bed mobility: Needs Assistance Bed Mobility: Sidelying to Sit, Sit to Supine   Sidelying to sit: Supervision   Sit to supine: Supervision   General bed mobility comments: for safety    Transfers Overall transfer level: Needs assistance Equipment used: Rollator (4  wheels) Transfers: Sit to/from Stand Sit to Stand: Contact guard assist, Supervision           General transfer comment: cues for safety, proper use of rollator and brakes    Ambulation/Gait Ambulation/Gait assistance: Contact guard assist Gait Distance (Feet): 120 Feet Assistive device: Rollator (4 wheels) Gait Pattern/deviations: Step-through pattern, Decreased stride length       General Gait Details: mildly unsteady gait, with CGA for safety. pt reported fatigue after above distance  Stairs            Wheelchair Mobility     Tilt Bed    Modified Rankin (Stroke Patients Only)       Balance Overall balance assessment: Needs assistance Sitting-balance support: Feet supported, No upper extremity supported Sitting balance-Leahy Scale: Fair     Standing balance support: During functional activity, Reliant on assistive device for balance, No upper extremity supported Standing balance-Leahy Scale: Fair                               Pertinent Vitals/Pain Pain Assessment Pain Assessment: Faces Faces Pain Scale: Hurts a little bit Pain Location: abd Pain Descriptors / Indicators: Sore Pain Intervention(s): Limited activity within patient's tolerance, Monitored during session    Home Living Family/patient expects to be discharged to:: Private residence Living Arrangements: Alone Available Help at Discharge: Family;Available PRN/intermittently Type of Home: Apartment Home Access: Level entry       Home Layout: One level Home Equipment: Cane - single point Additional Comments: pt ambulates to dining room for meals at ILF    Prior Function Prior  Level of Function : Independent/Modified Independent                     Extremity/Trunk Assessment   Upper Extremity Assessment Upper Extremity Assessment: Overall WFL for tasks assessed    Lower Extremity Assessment Lower Extremity Assessment: Overall WFL for tasks assessed        Communication   Communication Communication: No apparent difficulties    Cognition Arousal: Alert Behavior During Therapy: WFL for tasks assessed/performed   PT - Cognitive impairments: No apparent impairments                         Following commands: Intact       Cueing Cueing Techniques: Verbal cues     General Comments      Exercises     Assessment/Plan    PT Assessment Patient needs continued PT services  PT Problem List Decreased activity tolerance;Decreased balance;Decreased knowledge of use of DME;Decreased mobility       PT Treatment Interventions DME instruction;Therapeutic exercise;Gait training;Functional mobility training;Therapeutic activities;Patient/family education    PT Goals (Current goals can be found in the Care Plan section)  Acute Rehab PT Goals PT Goal Formulation: With patient Time For Goal Achievement: 09/27/23 Potential to Achieve Goals: Good    Frequency Min 2X/week     Co-evaluation               AM-PAC PT "6 Clicks" Mobility  Outcome Measure Help needed turning from your back to your side while in a flat bed without using bedrails?: A Little Help needed moving from lying on your back to sitting on the side of a flat bed without using bedrails?: A Little Help needed moving to and from a bed to a chair (including a wheelchair)?: A Little Help needed standing up from a chair using your arms (e.g., wheelchair or bedside chair)?: A Little Help needed to walk in hospital room?: A Little Help needed climbing 3-5 steps with a railing? : A Little 6 Click Score: 18    End of Session Equipment Utilized During Treatment: Gait belt Activity Tolerance: Patient tolerated treatment well Patient left: with call bell/phone within reach;in bed;with family/visitor present   PT Visit Diagnosis: Other abnormalities of gait and mobility (R26.89)    Time: 9147-8295 PT Time Calculation (min) (ACUTE ONLY): 25 min   Charges:   PT  Evaluation $PT Eval Low Complexity: 1 Low PT Treatments $Gait Training: 8-22 mins PT General Charges $$ ACUTE PT VISIT: 1 Visit         Imad Shostak, PT  Acute Rehab Dept ([facility]) [phone]  09/13/2023   [facility] 09/13/2023, 3:02 PM

## 2023-09-13 NOTE — Progress Notes (Signed)
 Mobility Specialist - Progress Note   09/13/23 1026  Mobility  Activity Ambulated with assistance in hallway  Level of Assistance Standby assist, set-up cues, supervision of patient - no hands on  Assistive Device Cane  Distance Ambulated (ft) 250 ft  Activity Response Tolerated well  Mobility Referral Yes  Mobility visit 1 Mobility  Mobility Specialist Start Time (ACUTE ONLY) 1016  Mobility Specialist Stop Time (ACUTE ONLY) 1025  Mobility Specialist Time Calculation (min) (ACUTE ONLY) 9 min   Pt received in bed and agreeable to mobility. No complaints during session. Pt to recliner after session with all needs met.    [facility]

## 2023-09-14 DIAGNOSIS — K810 Acute cholecystitis: Secondary | ICD-10-CM | POA: Diagnosis not present

## 2023-09-14 LAB — CBC WITH DIFFERENTIAL/PLATELET
Abs Immature Granulocytes: 0.26 10*3/uL — ABNORMAL HIGH (ref 0.00–0.07)
Basophils Absolute: 0.1 10*3/uL (ref 0.0–0.1)
Basophils Relative: 1 %
Eosinophils Absolute: 0.4 10*3/uL (ref 0.0–0.5)
Eosinophils Relative: 3 %
HCT: 46.8 % (ref 39.0–52.0)
Hemoglobin: 15 g/dL (ref 13.0–17.0)
Immature Granulocytes: 2 %
Lymphocytes Relative: 18 %
Lymphs Abs: 2 10*3/uL (ref 0.7–4.0)
MCH: 31.1 pg (ref 26.0–34.0)
MCHC: 32.1 g/dL (ref 30.0–36.0)
MCV: 97.1 fL (ref 80.0–100.0)
Monocytes Absolute: 1 10*3/uL (ref 0.1–1.0)
Monocytes Relative: 9 %
Neutro Abs: 7.3 10*3/uL (ref 1.7–7.7)
Neutrophils Relative %: 67 %
Platelets: 315 10*3/uL (ref 150–400)
RBC: 4.82 MIL/uL (ref 4.22–5.81)
RDW: 13.7 % (ref 11.5–15.5)
WBC: 11.1 10*3/uL — ABNORMAL HIGH (ref 4.0–10.5)
nRBC: 0 % (ref 0.0–0.2)

## 2023-09-14 LAB — BASIC METABOLIC PANEL WITH GFR
Anion gap: 11 (ref 5–15)
BUN: 10 mg/dL (ref 8–23)
CO2: 24 mmol/L (ref 22–32)
Calcium: 9.5 mg/dL (ref 8.9–10.3)
Chloride: 104 mmol/L (ref 98–111)
Creatinine, Ser: 0.98 mg/dL (ref 0.61–1.24)
GFR, Estimated: >60 mL/min (ref >60)
Glucose, Bld: 111 mg/dL — ABNORMAL HIGH (ref 70–99)
Potassium: 3.6 mmol/L (ref 3.5–5.1)
Sodium: 139 mmol/L (ref 135–145)

## 2023-09-14 MED ORDER — AMOXICILLIN-POT CLAVULANATE 875-125 MG PO TABS: 1.0000 | ORAL_TABLET | Freq: Two times a day (BID) | ORAL | 0 refills | Status: DC

## 2023-09-14 MED ORDER — ACETAMINOPHEN 500 MG PO TABS
1000.0000 mg | ORAL_TABLET | Freq: Three times a day (TID) | ORAL | 0 refills | Status: AC
Start: 2023-09-14 — End: ?

## 2023-09-14 NOTE — Plan of Care (Signed)
   Problem: Education: Goal: Knowledge of General Education information will improve Description: Including pain rating scale, medication(s)/side effects and non-pharmacologic comfort measures Outcome: Progressing   Problem: Health Behavior/Discharge Planning: Goal: Ability to manage health-related needs will improve Outcome: Progressing   Problem: Clinical Measurements: Goal: Will remain free from infection Outcome: Progressing

## 2023-09-14 NOTE — Discharge Summary (Signed)
 Physician Discharge Summary  NATANIEL GASPER ZOX:[id number] DOB: 1928-12-19 DOA: 09/09/2023  PCP: Tonna Frederic, MD  Admit date: 09/09/2023 Discharge date: 09/14/2023  Time spent: 40 minutes  Recommendations for Outpatient Follow-up:  Needs Chem-12 CBC in about 1 week Rec reimplementation of the HCTZ in the outpatient setting Pain control with Tylenol -wound care as well as drain removal in the outpatient setting reviewed with CCS  Discharge Diagnoses:  MAIN problem for hospitalization   Acute cholecystitis status post lap chole 6/5  Please see below for itemized issues addressed in HOpsital- refer to other progress notes for clarity if needed  Discharge Condition: Improved  Diet recommendation: Heart healthy  Filed Weights   09/09/23 1812  Weight: 87.1 kg    History of present illness:  89 year old male home dwelling Known paroxysmal A-fib CHA2DS2-VASc >4 on Eliquis  PTA, HTN, HLD Went to PCP 6/three 3-week history of fatigue SOB DOE anorexia-at baseline sleeps on 1 pillow but has become more short of breath on lying down Lab work showed elevated WBC LFT   Directed to ED 6/4 WBC 12.6 hemoglobin 14.2 platelet 251 AST/ALT 79/6.4 alk phos 141 bilirubin 1 potassium 2.9 BUN/creatinine 18/1.1 CT ABD pelvis acute cholecystitis multiple calcified gallstones including stone gallbladder neck moderate gallbladder wall thickening pericholecystic inflammation, 6 mm right pulmonary nodule General Surgery consulted Had some pauses   Status post lap chole 09/11/2022   Plan   Acute cholecystitis Status post lap chole 09/11/2022 Zosyn -->Augmentin total 5 d [eot 6/8]-prescription given at discharge to complete therapy--drain RUQ per surgery Continue scheduled Tylenol  1000 every 8, was no using any narcotics and the patient was stable on Tylenol -I recommended taking oxycodone  but the patient declined the same so we will go home on high-dose Tylenol  Overall better-drain to be addressed in  the outpatient setting later this week follow-up scheduled per Dr. Alethea Andes   Paroxysmal A-fib CHADVASC >4 Pauses up to 3 seconds seem to have resolved with lower dose of metoprolol  Eliquis  5 bid resumed -rate control with metoprolol  25 twice daily and outpatient follow-up   HTN continue lisinopril  40, amlodipine  10   Severe hypokalemia on admission, Replaced  now resolved Needs labs in a week  Discharge Exam: Vitals:   09/13/23 2054 09/14/23 0515  BP: (!) 147/90 (!) 141/84  Pulse: 94 (!) 105  Resp: 16 17  Temp: 98.2 F (36.8 C) 98.1 F (36.7 C)  SpO2: 96% 96%    Subj on day of d/c   Awake coherent pleasant no distress  General Exam on discharge  EOMI NCAT no focal deficit no icterus no pallor no rhonchi Power 5/5 S1-S2 no murmur ROM intact Drain RUQ with some serosanguineous drainage no tenderness Abdomens softer than prior Trace lower extremity edema  Discharge Instructions   Discharge Instructions     Diet - low sodium heart healthy   Complete by: As directed    Discharge instructions   Complete by: As directed    Follow-up with general surgery in the outpatient setting to make sure that you get the drain removed Continue all your medications without change except for your HCTZ and your potassium-I want you follow-up at your primary care physician with labs in about a week they may want to resume Lasix Report any bleeding from your rectum or severe nausea vomiting or significant pain and keep the drain in place until you are seen by surgery Use Tylenol  around-the-clock for pain-at your request you did not want any oxycodone  so I will hold off on  that Best of luck   Increase activity slowly   Complete by: As directed       Allergies as of 09/14/2023       Reactions   Clarithromycin    REACTION: dizziness   Indomethacin    Other Reaction(s): Unknown   Pneumococcal Vaccines    Local swelling at injection site that resolved after a few days.  Likely Arthus  reaction   Tuberculin Tests    Pt unsure- just told not to take it again        Medication List     STOP taking these medications    hydrochlorothiazide  25 MG tablet Commonly known as: HYDRODIURIL    potassium chloride  SA 20 MEQ tablet Commonly known as: KLOR-CON  M   timolol  0.5 % ophthalmic solution Commonly known as: TIMOPTIC        TAKE these medications    acetaminophen  500 MG tablet Commonly known as: TYLENOL  Take 2 tablets (1,000 mg total) by mouth every 8 (eight) hours.   amLODipine  10 MG tablet Commonly known as: NORVASC  Take 1 tablet (10 mg total) by mouth daily.   amoxicillin-clavulanate 875-125 MG tablet Commonly known as: AUGMENTIN Take 1 tablet by mouth every 12 (twelve) hours.   Eliquis  5 MG Tabs tablet Generic drug: apixaban  Take 5 mg by mouth 2 (two) times daily.   lisinopril  40 MG tablet Commonly known as: ZESTRIL  Take 1 tablet (40 mg total) by mouth daily.   lovastatin  40 MG tablet Commonly known as: MEVACOR  TAKE 1 TABLET AT BEDTIME   metoprolol  tartrate 100 MG tablet Commonly known as: LOPRESSOR  Take 0.5 tablets (50 mg total) by mouth 2 (two) times daily.   polyethylene glycol powder 17 GM/SCOOP powder Commonly known as: GLYCOLAX /MIRALAX  Take 17 g by mouth 2 (two) times daily as needed.               Durable Medical Equipment  (From admission, onward)           Start     Ordered   09/13/23 1515  For home use only DME Walker rolling  Once       Question Answer Comment  Walker: Other   Comments rollator   Patient needs a walker to treat with the following condition Difficulty in walking, not elsewhere classified      09/13/23 1515   09/13/23 1328  For home use only DME 4 wheeled rolling walker with seat  Once       Question:  Patient needs a walker to treat with the following condition  Answer:  Debility   09/13/23 1327           Allergies  Allergen Reactions   Clarithromycin     REACTION: dizziness    Indomethacin     Other Reaction(s): Unknown   Pneumococcal Vaccines     Local swelling at injection site that resolved after a few days.  Likely Arthus reaction   Tuberculin Tests     Pt unsure- just told not to take it again      The results of significant diagnostics from this hospitalization (including imaging, microbiology, ancillary and laboratory) are listed below for reference.    Significant Diagnostic Studies: CT ABDOMEN PELVIS W CONTRAST Result Date: 09/09/2023 CLINICAL DATA:  89 year old with abdominal pain.  Fatigue. EXAM: CT ABDOMEN AND PELVIS WITH CONTRAST TECHNIQUE: Multidetector CT imaging of the abdomen and pelvis was performed using the standard protocol following bolus administration of intravenous contrast. RADIATION DOSE REDUCTION: This exam was  performed according to the departmental dose-optimization program which includes automated exposure control, adjustment of the mA and/or kV according to patient size and/or use of iterative reconstruction technique. CONTRAST:  OMNIPAQUE  IOHEXOL  300 MG/ML  SOLN COMPARISON:  None Available. FINDINGS: Lower chest: 6 mm right lower lobe pulmonary nodule, series 5, image 12. Hepatobiliary: No focal hepatic abnormality. The gallbladder is moderately distended. There are multiple calcified gallstones as well as a stone in the gallbladder neck. Moderate gallbladder wall thickening and pericholecystic inflammation. Trace pericholecystic fluid. The common bile duct is normal in caliber. Trace central intrahepatic biliary ductal dilatation. Pancreas: No ductal dilatation or inflammation. Spleen: Normal in size without focal abnormality. Splenule inferiorly. Adrenals/Urinary Tract: No adrenal nodule. Bilateral water density lesions in the kidneys as well as low-density lesions too small to characterize. No further follow-up imaging is recommended. No hydronephrosis or renal calculi. Partially distended urinary bladder. No bladder wall thickening.  Stomach/Bowel: Small hiatal hernia. No bowel obstruction or inflammatory change. Left colonic diverticulosis. No diverticulitis. Small volume of stool in the colon. Normal appendix. Vascular/Lymphatic: Aortic atherosclerosis. No aortic aneurysm. Moderate branch atherosclerosis few prominent periportal nodes measuring up to 8 mm, likely reactive in the setting. No suspicious lymphadenopathy. Reproductive: Enlarged prostate gland spanning 6 cm transverse causing mass effect on the bladder base. Other: Small fat containing umbilical hernia.  No free air. Musculoskeletal: Multilevel degenerative change in the spine. There are no acute or suspicious osseous abnormalities. IMPRESSION: 1. Findings consistent with acute cholecystitis. Multiple calcified gallstones, including a stone in the gallbladder neck. Moderate gallbladder wall thickening and pericholecystic inflammation. Trace pericholecystic fluid. 2. Left colonic diverticulosis without diverticulitis. 3. Enlarged prostate gland causing mass effect on the bladder base. 4. Small hiatal hernia. 5. A 6 mm right lower lobe pulmonary nodule. Per Fleischner Society Guidelines, recommend a non-contrast Chest CT at 6-12 months. If patient is high risk for malignancy, consider an additional non-contrast Chest CT at 18-24 months. If patient is low risk for malignancy, non-contrast Chest CT at 18-24 months is optional. These guidelines do not apply to immunocompromised patients and patients with cancer. Follow up in patients with significant comorbidities as clinically warranted. For lung cancer screening, adhere to Lung-RADS guidelines. Reference: Radiology. 2017; 284(1):228-43. Aortic Atherosclerosis (ICD10-I70.0). Electronically Signed   By: Chadwick Colonel M.D.   On: 09/09/2023 20:05    Microbiology: No results found for this or any previous visit (from the past 240 hours).   Labs: Basic Metabolic Panel: Recent Labs  Lab 09/10/23 0437 09/10/23 0448  09/11/23 0445 09/11/23 0450 09/12/23 0755 09/13/23 0507 09/14/23 0504  NA 132*  --   --  136 136 137 139  K 4.6  --   --  2.8* 4.4 3.5 3.6  CL 98  --   --  101 104 103 104  CO2 25  --   --  25 23 26 24   GLUCOSE 109*  --   --  105* 129* 113* 111*  BUN 14  --   --  <5* 12 13 10   CREATININE 0.55*  --   --  <0.30* 1.09 1.14 0.98  CALCIUM 8.4*  --   --  8.9 8.5* 8.7* 9.5  MG  --  1.7 1.8  --   --   --   --    Liver Function Tests: Recent Labs  Lab 09/09/23 1425 09/10/23 0437 09/12/23 0755  AST 79* 49* 104*  ALT 64* 46* 71*  ALKPHOS 141* 82 88  BILITOT 1.0 1.2  0.9  PROT 6.2 5.2* 6.4*  ALBUMIN 3.4* 2.4* 2.8*   Recent Labs  Lab 09/09/23 1837  LIPASE 49   No results for input(s): "AMMONIA" in the last 168 hours. CBC: Recent Labs  Lab 09/10/23 0437 09/11/23 0450 09/12/23 0417 09/13/23 0507 09/14/23 0504  WBC 12.9* 8.6 13.2* 12.3* 11.1*  NEUTROABS 9.8* 5.8 11.4* 8.8* 7.3  HGB 12.8* 13.8 12.2* 12.9* 15.0  HCT 39.9 41.8 37.9* 40.9 46.8  MCV 97.6 96.3 96.9 97.8 97.1  PLT 217 237 276 268 315   Cardiac Enzymes: No results for input(s): "CKTOTAL", "CKMB", "CKMBINDEX", "TROPONINI" in the last 168 hours. BNP: BNP (last 3 results) No results for input(s): "BNP" in the last 8760 hours.  ProBNP (last 3 results) Recent Labs    09/09/23 1425  PROBNP 136.0*    CBG: No results for input(s): "GLUCAP" in the last 168 hours.  Signed:  Jai-Gurmukh Leeyah Heather MD   [facility] 09/14/2023, 8:11 AM

## 2023-09-14 NOTE — TOC Transition Note (Signed)
 Transition of Care [facility]) - Discharge Note   Patient Details  Name: Philip Donaldson MRN: [id number] Date of Birth: Apr 26, 1928  Transition of Care [facility]) CM/SW Contact:  Bari Leys, RN Phone Number: 09/14/2023, 10:15 AM   Clinical Narrative:   DC to Home order [facility]), [employer], rep-Mitch, accepted referral for Rollator, to deliver to bedside. No further TOC needs.      Final next level of care: Home/Self Care Barriers to Discharge: Barriers Resolved   Patient Goals and CMS Choice Patient states their goals for this hospitalization and ongoing recovery are:: return home to [facility]          Discharge Placement                       Discharge Plan and Services Additional resources added to the After Visit Summary for                  DME Arranged: Walker rolling with seat DME Agency: [employer] Date DME Agency Contacted: 09/14/23 Time DME Agency Contacted: 1014 Representative spoke with at DME Agency: Harriet Limber            Social Drivers of Health (SDOH) Interventions SDOH Screenings   Food Insecurity: No Food Insecurity (09/10/2023)  Housing: Low Risk  (09/10/2023)  Transportation Needs: No Transportation Needs (09/10/2023)  Utilities: Not At Risk (09/10/2023)  Alcohol Screen: Low Risk  (11/11/2022)  Depression (PHQ2-9): Low Risk  (07/24/2023)  Financial Resource Strain: Low Risk  (11/11/2022)  Physical Activity: Sufficiently Active (11/11/2022)  Social Connections: Moderately Isolated (09/10/2023)  Stress: No Stress Concern Present (11/11/2022)  Tobacco Use: Medium Risk (09/10/2023)  Health Literacy: Adequate Health Literacy (11/11/2022)     Readmission Risk Interventions    09/14/2023   10:13 AM  Readmission Risk Prevention Plan  Post Dischage Appt Complete  Medication Screening Complete  Transportation Screening Complete

## 2023-09-14 NOTE — Progress Notes (Signed)
 3 Days Post-Op   Subjective/Chief Complaint: No complaints   Objective: Vital signs in last 24 hours: Temp:  [97.6 F (36.4 C)-98.2 F (36.8 C)] 98.1 F (36.7 C) (06/08 0515) Pulse Rate:  [74-105] 105 (06/08 0515) Resp:  [16-18] 17 (06/08 0515) BP: (140-147)/(73-90) 141/84 (06/08 0515) SpO2:  [95 %-99 %] 96 % (06/08 0515) Last BM Date : 09/13/23  Intake/Output from previous day: 06/07 0701 - 06/08 0700 In: 1435.3 [P.O.:1270; I.V.:130.1; IV Piggyback:35.3] Out: 205 [Drains:205] Intake/Output this shift: No intake/output data recorded.  General appearance: alert and cooperative Resp: clear to auscultation bilaterally Cardio: regular rate and rhythm GI: Soft and nontender  Lab Results:  Recent Labs    09/13/23 0507 09/14/23 0504  WBC 12.3* 11.1*  HGB 12.9* 15.0  HCT 40.9 46.8  PLT 268 315   BMET Recent Labs    09/13/23 0507 09/14/23 0504  NA 137 139  K 3.5 3.6  CL 103 104  CO2 26 24  GLUCOSE 113* 111*  BUN 13 10  CREATININE 1.14 0.98  CALCIUM 8.7* 9.5   PT/INR No results for input(s): "LABPROT", "INR" in the last 72 hours. ABG No results for input(s): "PHART", "HCO3" in the last 72 hours.  Invalid input(s): "PCO2", "PO2"  Studies/Results: No results found.  Anti-infectives: Anti-infectives (From admission, onward)    Start     Dose/Rate Route Frequency Ordered Stop   09/13/23 2200  amoxicillin-clavulanate (AUGMENTIN) 875-125 MG per tablet 1 tablet        1 tablet Oral Every 12 hours 09/13/23 1327     09/10/23 0800  piperacillin -tazobactam (ZOSYN ) IVPB 3.375 g  Status:  Discontinued        3.375 g 12.5 mL/hr over 240 Minutes Intravenous Every 8 hours 09/10/23 0234 09/13/23 1327   09/09/23 2300  piperacillin -tazobactam (ZOSYN ) IVPB 3.375 g        3.375 g 100 mL/hr over 30 Minutes Intravenous  Once 09/09/23 2253 09/10/23 0742       Assessment/Plan: s/p Procedure(s): LAPAROSCOPIC CHOLECYSTECTOMY (N/A) INDOCYANINE GREEN  FLUORESCENCE IMAGING  (ICG) (N/A) Advance diet Okay for discharge from surgery standpoint.  He should keep his drain and follow-up this week for drain removal  LOS: 4 days    Lillette Reid III 09/14/2023

## 2023-09-14 NOTE — Plan of Care (Signed)
  Problem: Education: Goal: Knowledge of General Education information will improve Description: Including pain rating scale, medication(s)/side effects and non-pharmacologic comfort measures 09/14/2023 1236 by Alexa Hymen, LPN Outcome: Adequate for Discharge 09/14/2023 0729 by Alexa Hymen, LPN Outcome: Progressing   Problem: Health Behavior/Discharge Planning: Goal: Ability to manage health-related needs will improve 09/14/2023 1236 by Alexa Hymen, LPN Outcome: Adequate for Discharge 09/14/2023 0729 by Alexa Hymen, LPN Outcome: Progressing   Problem: Clinical Measurements: Goal: Ability to maintain clinical measurements within normal limits will improve Outcome: Adequate for Discharge Goal: Will remain free from infection 09/14/2023 1236 by Alexa Hymen, LPN Outcome: Adequate for Discharge 09/14/2023 0729 by Alexa Hymen, LPN Outcome: Progressing Goal: Diagnostic test results will improve Outcome: Adequate for Discharge Goal: Respiratory complications will improve Outcome: Adequate for Discharge Goal: Cardiovascular complication will be avoided Outcome: Adequate for Discharge   Problem: Activity: Goal: Risk for activity intolerance will decrease Outcome: Adequate for Discharge   Problem: Nutrition: Goal: Adequate nutrition will be maintained Outcome: Adequate for Discharge   Problem: Coping: Goal: Level of anxiety will decrease Outcome: Adequate for Discharge   Problem: Elimination: Goal: Will not experience complications related to bowel motility Outcome: Adequate for Discharge   Problem: Pain Managment: Goal: General experience of comfort will improve and/or be controlled Outcome: Adequate for Discharge   Problem: Safety: Goal: Ability to remain free from injury will improve Outcome: Adequate for Discharge   Problem: Skin Integrity: Goal: Risk for impaired skin integrity will decrease Outcome: Adequate for Discharge

## 2023-09-15 ENCOUNTER — Telehealth: Payer: Self-pay

## 2023-09-15 LAB — SURGICAL PATHOLOGY

## 2023-09-15 NOTE — Transitions of Care (Post Inpatient/ED Visit) (Signed)
   09/15/2023  Name: Philip Donaldson MRN: [id number] DOB: [birth year]  Today's TOC FU Call Status: Today's TOC FU Call Status:: Successful TOC FU Call Completed TOC FU Call Complete Date: 09/15/23 Patient's Name and Date of Birth confirmed.  Transition Care Management Follow-up Telephone Call Date of Discharge: 09/14/23 Discharge Facility: [facility]) Type of Discharge: Inpatient Admission Primary Inpatient Discharge Diagnosis:: cholecystitis How have you been since you were released from the hospital?: Better Any questions or concerns?: No  Items Reviewed: Did you receive and understand the discharge instructions provided?: Yes Medications obtained,verified, and reconciled?: Yes (Medications Reviewed) Any new allergies since your discharge?: No Dietary orders reviewed?: Yes Do you have support at home?: Yes People in Home [RPT]: child(ren), adult  Medications Reviewed Today: Medications Reviewed Today     Reviewed by Darrall Ellison, LPN (Licensed Practical Nurse) on 09/15/23 at 1103  Med List Status: <None>   Medication Order Taking? Sig Documenting Provider Last Dose Status Informant  acetaminophen  (TYLENOL ) 500 MG tablet [id number]  Take 2 tablets (1,000 mg total) by mouth every 8 (eight) hours. Samtani, Jai-Gurmukh, MD  Active   amLODipine  (NORVASC ) 10 MG tablet [id number] No Take 1 tablet (10 mg total) by mouth daily. Tonna Frederic, MD 09/08/2023 Active Self, Pharmacy Records  amoxicillin-clavulanate (AUGMENTIN) 875-125 MG tablet [id number]  Take 1 tablet by mouth every 12 (twelve) hours. Samtani, Jai-Gurmukh, MD  Active   apixaban  (ELIQUIS ) 5 MG TABS tablet [id number] No Take 5 mg by mouth 2 (two) times daily. [provider] 09/09/2023  7:30 AM Active Self, Pharmacy Records  lisinopril  (ZESTRIL ) 40 MG tablet [id number] No Take 1 tablet (40 mg total) by mouth daily. Tonna Frederic, MD 09/09/2023 Morning Active Self, Pharmacy Records  lovastatin  (MEVACOR ) 40  MG tablet [id number] No TAKE 1 TABLET AT BEDTIME Tonna Frederic, MD 09/08/2023 Active Self, Pharmacy Records  metoprolol  tartrate (LOPRESSOR ) 100 MG tablet [id number] No Take 0.5 tablets (50 mg total) by mouth 2 (two) times daily. Tonna Frederic, MD 09/09/2023 Morning Active Self, Pharmacy Records  polyethylene glycol powder (GLYCOLAX /MIRALAX ) 17 GM/SCOOP powder [id number]  Take 17 g by mouth 2 (two) times daily as needed. Tonna Frederic, MD  Active Self, Pharmacy Records  timolol  (TIMOPTIC ) 0.5 % ophthalmic solution [id number] No Place 1 drop into the right eye 2 (two) times daily. [provider] 09/09/2023 Morning Active Self, Pharmacy Records            Home Care and Equipment/Supplies: Were Home Health Services Ordered?: NA Any new equipment or medical supplies ordered?: Yes Name of Medical supply agency?: [employer] Were you able to get the equipment/medical supplies?: Yes Do you have any questions related to the use of the equipment/supplies?: No  Functional Questionnaire: Do you need assistance with bathing/showering or dressing?: No Do you need assistance with meal preparation?: No Do you need assistance with eating?: No Do you have difficulty maintaining continence: No Do you need assistance with getting out of bed/getting out of a chair/moving?: No Do you have difficulty managing or taking your medications?: No  Follow up appointments reviewed: PCP Follow-up appointment confirmed?: Yes Date of PCP follow-up appointment?: 09/22/23 Follow-up Provider: [facility] Follow-up appointment confirmed?: NA Do you need transportation to your follow-up appointment?: No Do you understand care options if your condition(s) worsen?: Yes-patient verbalized understanding    SIGNATURE Darrall Ellison, LPN [facility] Nurse Health Advisor Direct Dial [phone]

## 2023-09-19 NOTE — Progress Notes (Signed)
 JP drain output was 100 ml in the last 24 hours on 09/18/23 and 90 ml in 24 hours on 09/17/23. Daughter is reporting drain output of 70 ml this morning, emptied at 7:30 am. Drain output is serosanguineous now and 20 ml. Dr Curvin and Tonja, PA view this drainage and gave permission to remove drain. Discussed drain output amounts for 09/18/23 and 09/17/23 with Dr Aron this morning when she was in the office. Dr Aron had ordered that drain be removed if another surgeon in office was okay with color of drainage.    Cleansed around drain insertion site with ChloraPrep.  Suture was clipped.  Bulb was placed off suction.  Drain was removed without complications.  Drain site was covered with dry gauze and tape. Pt tolerated well.   Patient and daughter instructed to keep area covered with clean dry gauze for 3-4 days or until area scabs over and is no longer draining.   Daughter and patient will monitor for signs of infection: redness, swelling. Umbilicus with light pink skin spreading out around area. Puja, PA assessed area and instructed pt daughter to call office back if this becomes redder in color or spreads out with understanding verbalized.

## 2023-09-22 ENCOUNTER — Ambulatory Visit: Payer: Self-pay | Admitting: Family Medicine

## 2023-09-22 ENCOUNTER — Encounter: Payer: Self-pay | Admitting: Family Medicine

## 2023-09-22 ENCOUNTER — Ambulatory Visit (INDEPENDENT_AMBULATORY_CARE_PROVIDER_SITE_OTHER): Admitting: Family Medicine

## 2023-09-22 VITALS — BP 150/80 | HR 65 | Temp 97.1°F | Ht 66.0 in | Wt 182.0 lb

## 2023-09-22 DIAGNOSIS — E876 Hypokalemia: Secondary | ICD-10-CM

## 2023-09-22 DIAGNOSIS — I10 Essential (primary) hypertension: Secondary | ICD-10-CM | POA: Diagnosis not present

## 2023-09-22 DIAGNOSIS — Z09 Encounter for follow-up examination after completed treatment for conditions other than malignant neoplasm: Secondary | ICD-10-CM

## 2023-09-22 LAB — COMPREHENSIVE METABOLIC PANEL WITH GFR
ALT: 40 U/L (ref 0–53)
AST: 46 U/L — ABNORMAL HIGH (ref 0–37)
Albumin: 3.7 g/dL (ref 3.5–5.2)
Alkaline Phosphatase: 141 U/L — ABNORMAL HIGH (ref 39–117)
BUN: 12 mg/dL (ref 6–23)
CO2: 30 meq/L (ref 19–32)
Calcium: 9.8 mg/dL (ref 8.4–10.5)
Chloride: 102 meq/L (ref 96–112)
Creatinine, Ser: 0.98 mg/dL (ref 0.40–1.50)
GFR: 66.6 mL/min (ref >60.00)
Glucose, Bld: 90 mg/dL (ref 70–99)
Potassium: 5 meq/L (ref 3.5–5.1)
Sodium: 139 meq/L (ref 135–145)
Total Bilirubin: 0.8 mg/dL (ref 0.2–1.2)
Total Protein: 6.7 g/dL (ref 6.0–8.3)

## 2023-09-22 LAB — CBC
HCT: 42.7 % (ref 39.0–52.0)
Hemoglobin: 14.2 g/dL (ref 13.0–17.0)
MCHC: 33.2 g/dL (ref 30.0–36.0)
MCV: 93.5 fl (ref 78.0–100.0)
Platelets: 343 10*3/uL (ref 150.0–400.0)
RBC: 4.56 Mil/uL (ref 4.22–5.81)
RDW: 14.2 % (ref 11.5–15.5)
WBC: 11.6 10*3/uL — ABNORMAL HIGH (ref 4.0–10.5)

## 2023-09-22 MED ORDER — POTASSIUM CHLORIDE CRYS ER 20 MEQ PO TBCR: 20.0000 meq | EXTENDED_RELEASE_TABLET | Freq: Every day | ORAL | 2 refills | Status: DC

## 2023-09-22 MED ORDER — HYDROCHLOROTHIAZIDE 25 MG PO TABS: 25.0000 mg | ORAL_TABLET | Freq: Every day | ORAL | 3 refills | Status: DC

## 2023-09-22 NOTE — Progress Notes (Signed)
 Established Patient Office Visit   Subjective:  Patient ID: Philip Donaldson, male    DOB: 02-Dec-1928  Age: 89 y.o. MRN: [id number]  Chief Complaint  Patient presents with   hospital follow up    Gallbladder surgery around the 5th of this month says things are going good     HPI Encounter Diagnoses  Name Primary?   Hospital discharge follow-up Yes   Essential hypertension    Hypokalemia    Hospital discharge follow-up status post emergent cholecystectomy for acute acute cholecystitis.  Doing well.  Appetite is normal and stooling has been normal.  No significant ongoing abdominal pain.  HCTZ was held secondary to hypotension and hypokalemia.  Accompanied by his daughter.   Review of Systems  Constitutional: Negative.   HENT: Negative.    Eyes:  Negative for blurred vision, discharge and redness.  Respiratory: Negative.    Cardiovascular: Negative.   Gastrointestinal:  Negative for abdominal pain.  Genitourinary: Negative.   Musculoskeletal: Negative.  Negative for myalgias.  Skin:  Negative for rash.  Neurological:  Negative for tingling, loss of consciousness and weakness.  Endo/Heme/Allergies:  Negative for polydipsia.     Current Outpatient Medications:    acetaminophen  (TYLENOL ) 500 MG tablet, Take 2 tablets (1,000 mg total) by mouth every 8 (eight) hours., Disp: 30 tablet, Rfl: 0   amLODipine  (NORVASC ) 10 MG tablet, Take 1 tablet (10 mg total) by mouth daily., Disp: 90 tablet, Rfl: 3   apixaban  (ELIQUIS ) 5 MG TABS tablet, Take 5 mg by mouth 2 (two) times daily., Disp: , Rfl:    hydrochlorothiazide  (HYDRODIURIL ) 25 MG tablet, Take 1 tablet (25 mg total) by mouth daily., Disp: 90 tablet, Rfl: 3   lisinopril  (ZESTRIL ) 40 MG tablet, Take 1 tablet (40 mg total) by mouth daily., Disp: 90 tablet, Rfl: 3   lovastatin  (MEVACOR ) 40 MG tablet, TAKE 1 TABLET AT BEDTIME, Disp: 90 tablet, Rfl: 3   metoprolol  tartrate (LOPRESSOR ) 100 MG tablet, Take 0.5 tablets (50 mg total) by mouth  2 (two) times daily., Disp: 90 tablet, Rfl: 3   potassium chloride  SA (KLOR-CON  M) 20 MEQ tablet, Take 1 tablet (20 mEq total) by mouth daily., Disp: 90 tablet, Rfl: 2   timolol  (TIMOPTIC ) 0.5 % ophthalmic solution, Place 1 drop into the right eye 2 (two) times daily., Disp: , Rfl:    amoxicillin -clavulanate (AUGMENTIN ) 875-125 MG tablet, Take 1 tablet by mouth every 12 (twelve) hours. (Patient not taking: Reported on 09/22/2023), Disp: 2 tablet, Rfl: 0   polyethylene glycol powder (GLYCOLAX /MIRALAX ) 17 GM/SCOOP powder, Take 17 g by mouth 2 (two) times daily as needed. (Patient not taking: Reported on 09/22/2023), Disp: 3350 g, Rfl: 1   Objective:     BP (!) 150/80 (BP Location: Right Arm, Patient Position: Sitting, Cuff Size: Normal)   Pulse 65   Temp (!) 97.1 F (36.2 C) (Temporal)   Ht 5' 6 (1.676 m)   Wt 182 lb (82.6 kg)   SpO2 97%   BMI 29.38 kg/m    Physical Exam Constitutional:      General: He is not in acute distress.    Appearance: Normal appearance. He is not ill-appearing, toxic-appearing or diaphoretic.  HENT:     Head: Normocephalic and atraumatic.     Right Ear: External ear normal.     Left Ear: External ear normal.   Eyes:     General: No scleral icterus.       Right eye: No discharge.  Left eye: No discharge.     Extraocular Movements: Extraocular movements intact.     Conjunctiva/sclera: Conjunctivae normal.    Cardiovascular:     Rate and Rhythm: Normal rate and regular rhythm.  Pulmonary:     Effort: Pulmonary effort is normal. No respiratory distress.     Breath sounds: Normal breath sounds.  Abdominal:     General: Bowel sounds are normal.     Tenderness: There is no abdominal tenderness. There is no guarding or rebound.   Musculoskeletal:     Cervical back: No rigidity or tenderness.   Skin:    General: Skin is warm and dry.   Neurological:     Mental Status: He is alert and oriented to person, place, and time.   Psychiatric:         Mood and Affect: Mood normal.        Behavior: Behavior normal.      No results found for any visits on 09/22/23.    The ASCVD Risk score (Arnett DK, et al., 2019) failed to calculate for the following reasons:   The 2019 ASCVD risk score is only valid for ages 62 to 81    Assessment & Plan:   Hospital discharge follow-up -     CBC -     Comprehensive metabolic panel with GFR  Essential hypertension -     hydroCHLOROthiazide ; Take 1 tablet (25 mg total) by mouth daily.  Dispense: 90 tablet; Refill: 3 -     CBC -     Comprehensive metabolic panel with GFR  Hypokalemia -     Potassium Chloride  [facility]; Take 1 tablet (20 mEq total) by mouth daily.  Dispense: 90 tablet; Refill: 2 -     Comprehensive metabolic panel with GFR    Return Follow-up is scheduled for the 24 next month..  Restart HCTZ with potassium chloride .  Tonna Frederic, MD

## 2023-10-30 ENCOUNTER — Ambulatory Visit: Admitting: Family Medicine

## 2023-10-30 ENCOUNTER — Encounter: Payer: Self-pay | Admitting: Family Medicine

## 2023-10-30 VITALS — BP 118/76 | HR 76 | Temp 97.6°F | Ht 66.0 in | Wt 183.8 lb

## 2023-10-30 DIAGNOSIS — I10 Essential (primary) hypertension: Secondary | ICD-10-CM | POA: Diagnosis not present

## 2023-10-30 DIAGNOSIS — E876 Hypokalemia: Secondary | ICD-10-CM | POA: Diagnosis not present

## 2023-10-30 DIAGNOSIS — I4891 Unspecified atrial fibrillation: Secondary | ICD-10-CM

## 2023-10-30 DIAGNOSIS — E790 Hyperuricemia without signs of inflammatory arthritis and tophaceous disease: Secondary | ICD-10-CM | POA: Diagnosis not present

## 2023-10-30 DIAGNOSIS — I872 Venous insufficiency (chronic) (peripheral): Secondary | ICD-10-CM

## 2023-10-30 DIAGNOSIS — R7989 Other specified abnormal findings of blood chemistry: Secondary | ICD-10-CM

## 2023-10-30 LAB — URIC ACID: Uric Acid, Serum: 8.8 mg/dL — ABNORMAL HIGH (ref 4.0–7.8)

## 2023-10-30 LAB — CBC
HCT: 44.6 % (ref 39.0–52.0)
Hemoglobin: 14.8 g/dL (ref 13.0–17.0)
MCHC: 33.3 g/dL (ref 30.0–36.0)
MCV: 93 fl (ref 78.0–100.0)
Platelets: 196 K/uL (ref 150.0–400.0)
RBC: 4.79 Mil/uL (ref 4.22–5.81)
RDW: 14.3 % (ref 11.5–15.5)
WBC: 8.3 K/uL (ref 4.0–10.5)

## 2023-10-30 LAB — COMPREHENSIVE METABOLIC PANEL WITH GFR
ALT: 18 U/L (ref 0–53)
AST: 27 U/L (ref 0–37)
Albumin: 4.2 g/dL (ref 3.5–5.2)
Alkaline Phosphatase: 82 U/L (ref 39–117)
BUN: 18 mg/dL (ref 6–23)
CO2: 31 meq/L (ref 19–32)
Calcium: 10.3 mg/dL (ref 8.4–10.5)
Chloride: 101 meq/L (ref 96–112)
Creatinine, Ser: 1.12 mg/dL (ref 0.40–1.50)
GFR: 56.7 mL/min — ABNORMAL LOW (ref >60.00)
Glucose, Bld: 89 mg/dL (ref 70–99)
Potassium: 4.1 meq/L (ref 3.5–5.1)
Sodium: 139 meq/L (ref 135–145)
Total Bilirubin: 1 mg/dL (ref 0.2–1.2)
Total Protein: 7.6 g/dL (ref 6.0–8.3)

## 2023-10-30 NOTE — Progress Notes (Signed)
 Established Patient Office Visit   Subjective:  Patient ID: Philip Donaldson, male    DOB: 1928-08-30  Age: 89 y.o. MRN: [id number]  Chief Complaint  Patient presents with   Medical Management of Chronic Issues    3 month follow up. Pt is not fasting.     HPI Encounter Diagnoses  Name Primary?   Essential hypertension Yes   Atrial fibrillation, unspecified type (HCC)    Elevated LFTs    Elevated uric acid in blood    Hypokalemia    Follow-up of above.  Doing well.  No problems with medications.  No abdominal pain.  Stooling normally.  History of venous incompetence.  No history of CHF.  Continues potassium for hypokalemia.   Review of Systems  Constitutional: Negative.   HENT: Negative.    Eyes:  Negative for blurred vision, discharge and redness.  Respiratory: Negative.    Cardiovascular: Negative.   Gastrointestinal:  Negative for abdominal pain.  Genitourinary: Negative.   Musculoskeletal: Negative.  Negative for myalgias.  Skin:  Negative for rash.  Neurological:  Negative for tingling, loss of consciousness and weakness.  Endo/Heme/Allergies:  Negative for polydipsia.     Current Outpatient Medications:    acetaminophen  (TYLENOL ) 500 MG tablet, Take 2 tablets (1,000 mg total) by mouth every 8 (eight) hours., Disp: 30 tablet, Rfl: 0   amLODipine  (NORVASC ) 10 MG tablet, Take 1 tablet (10 mg total) by mouth daily., Disp: 90 tablet, Rfl: 3   apixaban  (ELIQUIS ) 5 MG TABS tablet, Take 5 mg by mouth 2 (two) times daily., Disp: , Rfl:    hydrochlorothiazide  (HYDRODIURIL ) 25 MG tablet, Take 1 tablet (25 mg total) by mouth daily., Disp: 90 tablet, Rfl: 3   lisinopril  (ZESTRIL ) 40 MG tablet, Take 1 tablet (40 mg total) by mouth daily., Disp: 90 tablet, Rfl: 3   lovastatin  (MEVACOR ) 40 MG tablet, TAKE 1 TABLET AT BEDTIME, Disp: 90 tablet, Rfl: 3   metoprolol  tartrate (LOPRESSOR ) 100 MG tablet, Take 0.5 tablets (50 mg total) by mouth 2 (two) times daily., Disp: 90 tablet, Rfl:  3   potassium chloride  SA (KLOR-CON  M) 20 MEQ tablet, Take 1 tablet (20 mEq total) by mouth daily., Disp: 90 tablet, Rfl: 2   timolol  (TIMOPTIC ) 0.5 % ophthalmic solution, Place 1 drop into the right eye 2 (two) times daily., Disp: , Rfl:    Objective:     BP 118/76 (Cuff Size: Normal)   Pulse 76   Temp 97.6 F (36.4 C) (Temporal)   Ht 5' 6 (1.676 m)   Wt 183 lb 12.8 oz (83.4 kg)   SpO2 97%   BMI 29.67 kg/m  BP Readings from Last 3 Encounters:  10/30/23 118/76  09/22/23 (!) 150/80  09/14/23 (!) 141/84   Wt Readings from Last 3 Encounters:  10/30/23 183 lb 12.8 oz (83.4 kg)  09/22/23 182 lb (82.6 kg)  09/09/23 192 lb (87.1 kg)      Physical Exam Constitutional:      General: He is not in acute distress.    Appearance: Normal appearance. He is not ill-appearing, toxic-appearing or diaphoretic.  HENT:     Head: Normocephalic and atraumatic.     Right Ear: External ear normal.     Left Ear: External ear normal.  Eyes:     General: No scleral icterus.       Right eye: No discharge.        Left eye: No discharge.     Extraocular Movements: Extraocular  movements intact.     Conjunctiva/sclera: Conjunctivae normal.  Cardiovascular:     Rate and Rhythm: Normal rate and regular rhythm.  Pulmonary:     Effort: Pulmonary effort is normal. No respiratory distress.     Breath sounds: Normal breath sounds. No wheezing or rales.  Abdominal:     General: Bowel sounds are normal.  Musculoskeletal:     Cervical back: No rigidity or tenderness.     Right lower leg: No edema.     Left lower leg: No edema.  Skin:    General: Skin is warm and dry.  Neurological:     Mental Status: He is alert and oriented to person, place, and time.  Psychiatric:        Mood and Affect: Mood normal.        Behavior: Behavior normal.      No results found for any visits on 10/30/23.    The ASCVD Risk score (Arnett DK, et al., 2019) failed to calculate for the following reasons:   The  2019 ASCVD risk score is only valid for ages 75 to 46    Assessment & Plan:   Essential hypertension -     CBC -     Comprehensive metabolic panel with GFR  Atrial fibrillation, unspecified type (HCC)  Elevated LFTs -     Comprehensive metabolic panel with GFR  Elevated uric acid in blood -     Comprehensive metabolic panel with GFR -     Uric acid  Hypokalemia -     Comprehensive metabolic panel with GFR    Return in about 8 weeks (around 12/25/2023), or Stop HCTZ and potassium.  Blood pressure is well-controlled on current regimen.  Concerned that HCTZ may be contributing to elevated uric acid.  Have asked patient to hold the HCTZ and potassium.  Recheck in 8 weeks.  Could use furosemide if needed.  Elsie Sim Lent, MD

## 2023-10-31 ENCOUNTER — Ambulatory Visit: Payer: Self-pay | Admitting: Family Medicine

## 2023-12-10 ENCOUNTER — Emergency Department (HOSPITAL_BASED_OUTPATIENT_CLINIC_OR_DEPARTMENT_OTHER)

## 2023-12-10 ENCOUNTER — Other Ambulatory Visit: Payer: Self-pay

## 2023-12-10 ENCOUNTER — Encounter (HOSPITAL_BASED_OUTPATIENT_CLINIC_OR_DEPARTMENT_OTHER): Payer: Self-pay

## 2023-12-10 ENCOUNTER — Emergency Department (HOSPITAL_BASED_OUTPATIENT_CLINIC_OR_DEPARTMENT_OTHER)
Admission: EM | Admit: 2023-12-10 | Discharge: 2023-12-10 | Disposition: A | Discharge status: Home / Self Care | Attending: Emergency Medicine | Admitting: Emergency Medicine

## 2023-12-10 DIAGNOSIS — R0602 Shortness of breath: Secondary | ICD-10-CM | POA: Diagnosis not present

## 2023-12-10 DIAGNOSIS — I129 Hypertensive chronic kidney disease with stage 1 through stage 4 chronic kidney disease, or unspecified chronic kidney disease: Secondary | ICD-10-CM | POA: Diagnosis not present

## 2023-12-10 DIAGNOSIS — N183 Chronic kidney disease, stage 3 unspecified: Secondary | ICD-10-CM | POA: Diagnosis not present

## 2023-12-10 DIAGNOSIS — Z85828 Personal history of other malignant neoplasm of skin: Secondary | ICD-10-CM | POA: Diagnosis not present

## 2023-12-10 DIAGNOSIS — M7989 Other specified soft tissue disorders: Secondary | ICD-10-CM | POA: Diagnosis not present

## 2023-12-10 DIAGNOSIS — I700 Atherosclerosis of aorta: Secondary | ICD-10-CM | POA: Diagnosis not present

## 2023-12-10 DIAGNOSIS — R600 Localized edema: Secondary | ICD-10-CM | POA: Diagnosis not present

## 2023-12-10 DIAGNOSIS — M109 Gout, unspecified: Secondary | ICD-10-CM | POA: Insufficient documentation

## 2023-12-10 DIAGNOSIS — Z79899 Other long term (current) drug therapy: Secondary | ICD-10-CM | POA: Insufficient documentation

## 2023-12-10 DIAGNOSIS — J810 Acute pulmonary edema: Secondary | ICD-10-CM

## 2023-12-10 DIAGNOSIS — Z7901 Long term (current) use of anticoagulants: Secondary | ICD-10-CM | POA: Diagnosis not present

## 2023-12-10 DIAGNOSIS — N5089 Other specified disorders of the male genital organs: Secondary | ICD-10-CM | POA: Diagnosis not present

## 2023-12-10 DIAGNOSIS — I517 Cardiomegaly: Secondary | ICD-10-CM | POA: Diagnosis not present

## 2023-12-10 DIAGNOSIS — R0989 Other specified symptoms and signs involving the circulatory and respiratory systems: Secondary | ICD-10-CM | POA: Diagnosis not present

## 2023-12-10 LAB — CBC
HCT: 44.5 % (ref 39.0–52.0)
Hemoglobin: 14.7 g/dL (ref 13.0–17.0)
MCH: 30.6 pg (ref 26.0–34.0)
MCHC: 33 g/dL (ref 30.0–36.0)
MCV: 92.5 fL (ref 80.0–100.0)
Platelets: 204 K/uL (ref 150–400)
RBC: 4.81 MIL/uL (ref 4.22–5.81)
RDW: 13.6 % (ref 11.5–15.5)
WBC: 10.8 K/uL — ABNORMAL HIGH (ref 4.0–10.5)
nRBC: 0 % (ref 0.0–0.2)

## 2023-12-10 LAB — BASIC METABOLIC PANEL WITH GFR
Anion gap: 12 (ref 5–15)
BUN: 17 mg/dL (ref 8–23)
CO2: 26 mmol/L (ref 22–32)
Calcium: 10.2 mg/dL (ref 8.9–10.3)
Chloride: 101 mmol/L (ref 98–111)
Creatinine, Ser: 1.16 mg/dL (ref 0.61–1.24)
GFR, Estimated: 59 mL/min — ABNORMAL LOW (ref >60)
Glucose, Bld: 133 mg/dL — ABNORMAL HIGH (ref 70–99)
Potassium: 3.8 mmol/L (ref 3.5–5.1)
Sodium: 139 mmol/L (ref 135–145)

## 2023-12-10 LAB — PRO BRAIN NATRIURETIC PEPTIDE: Pro Brain Natriuretic Peptide: 2122 pg/mL — ABNORMAL HIGH (ref <300.0)

## 2023-12-10 MED ORDER — PREDNISONE 10 MG PO TABS: 40.0000 mg | ORAL_TABLET | Freq: Every day | ORAL | 0 refills | Status: AC

## 2023-12-10 MED ORDER — PREDNISONE 50 MG PO TABS
60.0000 mg | ORAL_TABLET | Freq: Once | ORAL | Status: AC
  Administered 2023-12-10: 60 mg via ORAL
  Filled 2023-12-10: qty 1

## 2023-12-10 MED ORDER — FUROSEMIDE 20 MG PO TABS: 20.0000 mg | ORAL_TABLET | Freq: Every day | ORAL | 0 refills | Status: DC

## 2023-12-10 NOTE — ED Triage Notes (Signed)
 Pt arrives POV with dtr for worsening R arm swelling. Pt seen at [facility] today and referred to ED for further testing and treatment d/t pitting edema to bilateral lower extremities, scrotal area, and abd distention. C/o slight tingling and difficulty closing hand, with redness and pain with movement. Denies hx of CHF or kidney disease. Pt on eliquis  for afib. Pt also c/o new SOB on exertion.

## 2023-12-10 NOTE — Discharge Instructions (Addendum)
 Take the Lasix  for the fluid overload both in the legs and there are some evidence of that being in the lungs as well.  Make an appointment follow-up with cardiology and/or your primary care doctor.  For recheck of your electrolytes and see how you are doing on the fluid pill.  Take the prednisone  as directed for the inflammatory arthritis of the right hand which is most likely gout.  You can also take extra strength Tylenol  2 tablets every 8 hours as needed for additional pain relief.  Follow-up with your primary care doctor regarding the concern for gout.  Follow-up with cardiology and your primary care doctor regarding the mild congestive heart failure and fluid overload.  Kidney function was normal today.

## 2023-12-10 NOTE — ED Provider Notes (Addendum)
 [facility] Provider Note   CSN: [id number] Arrival date & time: 12/10/23  8079     Patient presents with: Arm Swelling (R hand), Shortness of Breath, and Leg Swelling   Philip Donaldson is a 89 y.o. male.   [facility] which she says she has had long-term.  His primary care doctor took him off of his diuretic recently he thinks because his uric acid level was high.  He has had gout in the past but not recently.  Denies any shortness of breath.  At rest.  But triage states that shortness of breath with exertion.  No history of congestive heart failure or any known kidney disease.  Does have atrial fibs on Eliquis  for that.  What he really went to the urgent care for was right hand swelling and pain.  Without injury.  Temp here 98.2 pulse 86 respirations 26 blood pressure 190/88 oxygen saturation on room air 98%.  Past medical history significant for melanoma of the skin hyperlipidemia hypertension diverticulitis chronic kidney disease stage III pain in right hand in January 2024 high cholesterol new onset atrial fibrillation in February 2024.  Has gallbladder removed.  Never used tobacco products.       Prior to Admission medications   Medication Sig Start Date End Date Taking? Authorizing Provider  acetaminophen  (TYLENOL ) 500 MG tablet Take 2 tablets (1,000 mg total) by mouth every 8 (eight) hours. 09/14/23   Samtani, Jai-Gurmukh, MD  amLODipine  (NORVASC ) 10 MG tablet Take 1 tablet (10 mg total) by mouth daily. 04/24/23   Berneta Elsie Sayre, MD  apixaban  (ELIQUIS ) 5 MG TABS tablet Take 5 mg by mouth 2 (two) times daily.    [provider]  hydrochlorothiazide  (HYDRODIURIL ) 25 MG tablet Take 1 tablet (25 mg total) by mouth daily. 09/22/23   Berneta Elsie Sayre, MD  lisinopril  (ZESTRIL ) 40 MG tablet Take 1 tablet (40 mg total) by mouth daily. 04/24/23   Berneta Elsie Sayre, MD  lovastatin   (MEVACOR ) 40 MG tablet TAKE 1 TABLET AT BEDTIME 05/19/23   Berneta Elsie Sayre, MD  metoprolol  tartrate (LOPRESSOR ) 100 MG tablet Take 0.5 tablets (50 mg total) by mouth 2 (two) times daily. 04/24/23   Berneta Elsie Sayre, MD  potassium chloride  SA (KLOR-CON  M) 20 MEQ tablet Take 1 tablet (20 mEq total) by mouth daily. 09/22/23   Berneta Elsie Sayre, MD  timolol  (TIMOPTIC ) 0.5 % ophthalmic solution Place 1 drop into the right eye 2 (two) times daily. 06/06/23   [provider]    Allergies: Clarithromycin, Indomethacin, and Tuberculin tests    Review of Systems  Constitutional:  Negative for chills and fever.  HENT:  Negative for ear pain and sore throat.   Eyes:  Negative for pain and visual disturbance.  Respiratory:  Positive for shortness of breath. Negative for cough.   Cardiovascular:  Positive for leg swelling. Negative for chest pain and palpitations.  Gastrointestinal:  Negative for abdominal pain and vomiting.  Genitourinary:  Negative for dysuria and hematuria.  Musculoskeletal:  Positive for joint swelling. Negative for arthralgias and back pain.  Skin:  Negative for color change and rash.  Neurological:  Negative for seizures and syncope.  All other systems reviewed and are negative.   Updated Vital Signs BP (!) 168/62   Pulse 75   Temp 98.2 F (36.8 C) (Oral)   Resp 19   Ht 1.676 m (5' 6)  Wt 87.1 kg   SpO2 97%   BMI 30.99 kg/m   Physical Exam Vitals and nursing note reviewed.  Constitutional:      General: He is not in acute distress.    Appearance: Normal appearance. He is well-developed.  HENT:     Head: Normocephalic and atraumatic.  Eyes:     Conjunctiva/sclera: Conjunctivae normal.     Pupils: Pupils are equal, round, and reactive to light.  Cardiovascular:     Rate and Rhythm: Normal rate and regular rhythm.     Heart sounds: No murmur heard. Pulmonary:     Effort: Pulmonary effort is normal. No respiratory distress.     Breath  sounds: Normal breath sounds. No wheezing or rales.  Abdominal:     General: There is no distension.     Palpations: Abdomen is soft.     Tenderness: There is no abdominal tenderness. There is no guarding.  Genitourinary:    Comments: Some edema to the scrotum.  Nontender. Musculoskeletal:        General: Swelling and tenderness present.     Cervical back: Normal range of motion and neck supple.     Right lower leg: Edema present.     Left lower leg: Edema present.     Comments: Right hand with swelling to the hand predominantly around the wrist area of metacarpal area.  With some edema that goes just into the distal forearm.  No swelling at the elbow or on up.  Radial pulses 2+ and strong cap refills good.  Pretty good range of motion of the fingers.  Discomfort with range of motion at the wrist.  Bilateral lower extremity edema.  Not involving the thighs.  Some chronic skin changes.  No skin breakdown.  Skin:    General: Skin is warm and dry.     Capillary Refill: Capillary refill takes less than 2 seconds.  Neurological:     General: No focal deficit present.     Mental Status: He is alert and oriented to person, place, and time.  Psychiatric:        Mood and Affect: Mood normal.     (all labs ordered are listed, but only abnormal results are displayed) Labs Reviewed  BASIC METABOLIC PANEL WITH GFR - Abnormal; Notable for the following components:      Result Value   Glucose, Bld 133 (*)    GFR, Estimated 59 (*)    All other components within normal limits  CBC - Abnormal; Notable for the following components:   WBC 10.8 (*)    All other components within normal limits  PRO BRAIN NATRIURETIC PEPTIDE - Abnormal; Notable for the following components:   Pro Brain Natriuretic Peptide 2,122.0 (*)    All other components within normal limits    EKG: EKG Interpretation Date/Time:  Wednesday December 10 2023 20:00:23 EDT Ventricular Rate:  84 PR Interval:    QRS  Duration:  100 QT Interval:  369 QTC Calculation: 437 R Axis:   -61  Text Interpretation: Atrial fibrillation Ventricular premature complex Left anterior fascicular block Anterior infarct, old Minimal ST depression, lateral leads Confirmed by Sedale Jenifer [phone]) on 12/10/2023 8:06:33 PM  Radiology: No results found.   Procedures   Medications Ordered in the ED - No data to display  Medical Decision Making Amount and/or Complexity of Data Reviewed Labs: ordered. Radiology: ordered.   I will get chest x-ray.  Not ordered.  BNP markedly elevated 2122.  Patient was on a diuretic in the past.  Patient denying any chest pain.  Did deny to me shortness of breath but they wrote down that he had some exertional short of breath.  Basic metabolic panel GFR pretty good at 59.  So has some mild chronic kidney disease.  Glucose 133 electrolytes are normal.  White count 10.8 hemoglobin 14.7.  Patient is taking his Eliquis .  Cardiac monitor consistent with atrial fibs.  I suspect that the right hand swelling is gout.  Plan to treat that with prednisone .  But he does have the elevated BNP so we will get a two-view chest.  May need to go back on a diuretic as renal function seems to be good enough for that.  Looks like the diuretic he was on which is hydrochlorothiazide .  But maybe some Lasix  would be helpful.  Chest x-ray cardiomegaly with slight vascular congestion patient in no respiratory distress.  X-ray of the right hand prominent soft tissue edema tickly about the dorsum of the hand no acute osseous abnormality multifocal osteoarthritis.  Seems to fit to an inflammatory arthritis could be gout and has had history of gout in the past.  Will start him back on Lasix  and have him follow-up with primary care doctor.  And we will start him on prednisone .  Final diagnoses:  Acute gout of right wrist, unspecified cause  Bilateral lower extremity edema    ED  Discharge Orders     None          Geraldene Hamilton, MD 12/10/23 7947    Geraldene Hamilton, MD 12/10/23 2235

## 2023-12-10 NOTE — ED Notes (Signed)
 Patient taken to xray at this time.

## 2023-12-11 ENCOUNTER — Telehealth: Payer: Self-pay

## 2023-12-11 NOTE — Telephone Encounter (Signed)
 Copied from CRM [id number]. Topic: Appointments - Appointment Scheduling >> Dec 11, 2023  9:39 AM Pinkey ORN wrote: Patient/patient representative is calling to schedule an appointment. Refer to attachments for appointment information. >> Dec 11, 2023  9:41 AM Pinkey ORN wrote: Patient was discharged from the ER on 12/10/23, patient is needing to schedule an follow up appointment. Please follow up with daughter at [phone]

## 2023-12-18 ENCOUNTER — Encounter: Payer: Self-pay | Admitting: Family Medicine

## 2023-12-18 ENCOUNTER — Ambulatory Visit: Payer: Self-pay | Admitting: Family Medicine

## 2023-12-18 ENCOUNTER — Ambulatory Visit: Admitting: Family Medicine

## 2023-12-18 VITALS — BP 138/78 | HR 74 | Temp 97.4°F | Ht 66.0 in | Wt 186.6 lb

## 2023-12-18 DIAGNOSIS — I872 Venous insufficiency (chronic) (peripheral): Secondary | ICD-10-CM

## 2023-12-18 DIAGNOSIS — Z23 Encounter for immunization: Secondary | ICD-10-CM

## 2023-12-18 DIAGNOSIS — R7989 Other specified abnormal findings of blood chemistry: Secondary | ICD-10-CM

## 2023-12-18 DIAGNOSIS — Z8739 Personal history of other diseases of the musculoskeletal system and connective tissue: Secondary | ICD-10-CM

## 2023-12-18 DIAGNOSIS — E790 Hyperuricemia without signs of inflammatory arthritis and tophaceous disease: Secondary | ICD-10-CM

## 2023-12-18 LAB — BRAIN NATRIURETIC PEPTIDE: Pro B Natriuretic peptide (BNP): 104 pg/mL — ABNORMAL HIGH (ref 0.0–100.0)

## 2023-12-18 MED ORDER — ALLOPURINOL 100 MG PO TABS: 100.0000 mg | ORAL_TABLET | Freq: Every day | ORAL | 1 refills | Status: DC

## 2023-12-18 MED ORDER — COLCHICINE 0.6 MG PO TABS: 0.6000 mg | ORAL_TABLET | Freq: Every day | ORAL | 0 refills | Status: DC

## 2023-12-18 MED ORDER — FUROSEMIDE 20 MG PO TABS
20.0000 mg | ORAL_TABLET | Freq: Every day | ORAL | 2 refills | Status: AC
Start: 2023-12-18 — End: ?

## 2023-12-18 NOTE — Progress Notes (Signed)
 Established Patient Office Visit   Subjective:  Patient ID: Philip Donaldson, male    DOB: 1929/03/27  Age: 89 y.o. MRN: [id number]  Chief Complaint  Patient presents with   Follow-up    Follow up ER visit. Pt was not admited. Gout and lower extremity edema. Pt states improvement. Started taking Lasix  again.     HPI Encounter Diagnoses  Name Primary?   History of gout Yes   Immunization due    Elevated brain natriuretic peptide (BNP) level    Venous incompetence    Elevated uric acid in blood    Recent ER visit for presumed gouty attack.  Symptoms responded to prednisone .  There was swelling in his right hand with some erythema.  Uric acid has been elevated. chest x-ray at the emergency room visit showed mild pulmonary congestion and cardiomegaly.  BNP was measured over 2000.  History of A-fib.  Echocardiogram in 2024 showed normal left ventricular function.  he had denied shortness of breath at that time.  Is here with his daughter Barnie.   Review of Systems  Constitutional: Negative.   HENT: Negative.    Eyes:  Negative for blurred vision, discharge and redness.  Respiratory: Negative.    Cardiovascular: Negative.   Gastrointestinal:  Negative for abdominal pain.  Genitourinary: Negative.   Musculoskeletal: Negative.  Negative for myalgias.  Skin:  Negative for rash.  Neurological:  Negative for tingling, loss of consciousness and weakness.  Endo/Heme/Allergies:  Negative for polydipsia.     Current Outpatient Medications:    acetaminophen  (TYLENOL ) 500 MG tablet, Take 2 tablets (1,000 mg total) by mouth every 8 (eight) hours., Disp: 30 tablet, Rfl: 0   allopurinol  (ZYLOPRIM ) 100 MG tablet, Take 1 tablet (100 mg total) by mouth daily., Disp: 90 tablet, Rfl: 1   amLODipine  (NORVASC ) 10 MG tablet, Take 1 tablet (10 mg total) by mouth daily., Disp: 90 tablet, Rfl: 3   apixaban  (ELIQUIS ) 5 MG TABS tablet, Take 5 mg by mouth 2 (two) times daily., Disp: , Rfl:    colchicine   0.6 MG tablet, Take 1 tablet (0.6 mg total) by mouth daily., Disp: 90 tablet, Rfl: 0   lisinopril  (ZESTRIL ) 40 MG tablet, Take 1 tablet (40 mg total) by mouth daily., Disp: 90 tablet, Rfl: 3   lovastatin  (MEVACOR ) 40 MG tablet, TAKE 1 TABLET AT BEDTIME, Disp: 90 tablet, Rfl: 3   metoprolol  tartrate (LOPRESSOR ) 100 MG tablet, Take 0.5 tablets (50 mg total) by mouth 2 (two) times daily., Disp: 90 tablet, Rfl: 3   predniSONE  (DELTASONE ) 10 MG tablet, Take 4 tablets (40 mg total) by mouth daily., Disp: 20 tablet, Rfl: 0   timolol  (TIMOPTIC ) 0.5 % ophthalmic solution, Place 1 drop into the right eye 2 (two) times daily., Disp: , Rfl:    furosemide  (LASIX ) 20 MG tablet, Take 1 tablet (20 mg total) by mouth daily., Disp: 90 tablet, Rfl: 2   potassium chloride  SA (KLOR-CON  M) 20 MEQ tablet, Take 1 tablet (20 mEq total) by mouth daily. (Patient not taking: Reported on 12/18/2023), Disp: 90 tablet, Rfl: 2   Objective:     BP 138/78 (BP Location: Left Arm, Patient Position: Sitting, Cuff Size: Normal)   Pulse 74   Temp (!) 97.4 F (36.3 C) (Temporal)   Ht 5' 6 (1.676 m)   Wt 186 lb 9.6 oz (84.6 kg)   SpO2 97%   BMI 30.12 kg/m    Physical Exam Constitutional:      General: He  is not in acute distress.    Appearance: Normal appearance. He is not ill-appearing, toxic-appearing or diaphoretic.  HENT:     Head: Normocephalic and atraumatic.     Right Ear: External ear normal.     Left Ear: External ear normal.  Eyes:     General: No scleral icterus.       Right eye: No discharge.        Left eye: No discharge.     Extraocular Movements: Extraocular movements intact.     Conjunctiva/sclera: Conjunctivae normal.  Cardiovascular:     Rate and Rhythm: Rhythm irregularly irregular.  Pulmonary:     Effort: Pulmonary effort is normal. No respiratory distress.     Breath sounds: No wheezing, rhonchi or rales.  Skin:    General: Skin is warm and dry.  Neurological:     Mental Status: He is alert  and oriented to person, place, and time.  Psychiatric:        Mood and Affect: Mood normal.        Behavior: Behavior normal.      No results found for any visits on 12/18/23.    The ASCVD Risk score (Arnett DK, et al., 2019) failed to calculate for the following reasons:   The 2019 ASCVD risk score is only valid for ages 36 to 25    Assessment & Plan:   History of gout -     Allopurinol ; Take 1 tablet (100 mg total) by mouth daily.  Dispense: 90 tablet; Refill: 1 -     Colchicine ; Take 1 tablet (0.6 mg total) by mouth daily.  Dispense: 90 tablet; Refill: 0  Immunization due -     Flu vaccine HIGH DOSE PF(Fluzone Trivalent)  Elevated brain natriuretic peptide (BNP) level -     Furosemide ; Take 1 tablet (20 mg total) by mouth daily.  Dispense: 90 tablet; Refill: 2 -     Brain natriuretic peptide  Venous incompetence  Elevated uric acid in blood -     Allopurinol ; Take 1 tablet (100 mg total) by mouth daily.  Dispense: 90 tablet; Refill: 1 -     Colchicine ; Take 1 tablet (0.6 mg total) by mouth daily.  Dispense: 90 tablet; Refill: 0    Return in about 3 months (around 03/18/2024), or Stop HCTZ.  Continue furosemide  20 mg with potassium daily..  Continue with Lasix  20 mg along with potassium.  Has an abundant supply of potassium and needs no further refills for the extended future.  Will start allopurinol  100 mg and colchicine  0.6 for treatment of elevated uric acid and gouty attacks.  Rechecking BNP.  Consider cardiology referral if it remains elevated.  Elsie Sim Lent, MD

## 2024-01-01 ENCOUNTER — Ambulatory Visit: Admitting: Family Medicine

## 2024-01-08 DIAGNOSIS — H401121 Primary open-angle glaucoma, left eye, mild stage: Secondary | ICD-10-CM | POA: Diagnosis not present

## 2024-01-08 DIAGNOSIS — H11131 Conjunctival pigmentations, right eye: Secondary | ICD-10-CM | POA: Diagnosis not present

## 2024-01-08 DIAGNOSIS — Z961 Presence of intraocular lens: Secondary | ICD-10-CM | POA: Diagnosis not present

## 2024-01-08 DIAGNOSIS — H401112 Primary open-angle glaucoma, right eye, moderate stage: Secondary | ICD-10-CM | POA: Diagnosis not present

## 2024-01-20 DIAGNOSIS — D225 Melanocytic nevi of trunk: Secondary | ICD-10-CM | POA: Diagnosis not present

## 2024-01-20 DIAGNOSIS — Z08 Encounter for follow-up examination after completed treatment for malignant neoplasm: Secondary | ICD-10-CM | POA: Diagnosis not present

## 2024-01-20 DIAGNOSIS — L570 Actinic keratosis: Secondary | ICD-10-CM | POA: Diagnosis not present

## 2024-01-20 DIAGNOSIS — X32XXXD Exposure to sunlight, subsequent encounter: Secondary | ICD-10-CM | POA: Diagnosis not present

## 2024-01-20 DIAGNOSIS — B078 Other viral warts: Secondary | ICD-10-CM | POA: Diagnosis not present

## 2024-01-20 DIAGNOSIS — Z1283 Encounter for screening for malignant neoplasm of skin: Secondary | ICD-10-CM | POA: Diagnosis not present

## 2024-01-20 DIAGNOSIS — L821 Other seborrheic keratosis: Secondary | ICD-10-CM | POA: Diagnosis not present

## 2024-01-20 DIAGNOSIS — Z86006 Personal history of melanoma in-situ: Secondary | ICD-10-CM | POA: Diagnosis not present

## 2024-01-28 DIAGNOSIS — E876 Hypokalemia: Secondary | ICD-10-CM | POA: Diagnosis not present

## 2024-01-28 DIAGNOSIS — H3530 Unspecified macular degeneration: Secondary | ICD-10-CM | POA: Diagnosis not present

## 2024-01-28 DIAGNOSIS — M1990 Unspecified osteoarthritis, unspecified site: Secondary | ICD-10-CM | POA: Diagnosis not present

## 2024-01-28 DIAGNOSIS — I509 Heart failure, unspecified: Secondary | ICD-10-CM | POA: Diagnosis not present

## 2024-01-28 DIAGNOSIS — H409 Unspecified glaucoma: Secondary | ICD-10-CM | POA: Diagnosis not present

## 2024-01-28 DIAGNOSIS — Z7901 Long term (current) use of anticoagulants: Secondary | ICD-10-CM | POA: Diagnosis not present

## 2024-01-28 DIAGNOSIS — I4891 Unspecified atrial fibrillation: Secondary | ICD-10-CM | POA: Diagnosis not present

## 2024-01-28 DIAGNOSIS — E669 Obesity, unspecified: Secondary | ICD-10-CM | POA: Diagnosis not present

## 2024-01-28 DIAGNOSIS — D6869 Other thrombophilia: Secondary | ICD-10-CM | POA: Diagnosis not present

## 2024-01-28 DIAGNOSIS — I130 Hypertensive heart and chronic kidney disease with heart failure and stage 1 through stage 4 chronic kidney disease, or unspecified chronic kidney disease: Secondary | ICD-10-CM | POA: Diagnosis not present

## 2024-01-28 DIAGNOSIS — M109 Gout, unspecified: Secondary | ICD-10-CM | POA: Diagnosis not present

## 2024-01-28 DIAGNOSIS — E785 Hyperlipidemia, unspecified: Secondary | ICD-10-CM | POA: Diagnosis not present

## 2024-01-28 DIAGNOSIS — K5900 Constipation, unspecified: Secondary | ICD-10-CM | POA: Diagnosis not present

## 2024-01-28 DIAGNOSIS — Z6830 Body mass index (BMI) 30.0-30.9, adult: Secondary | ICD-10-CM | POA: Diagnosis not present

## 2024-02-13 ENCOUNTER — Ambulatory Visit

## 2024-02-13 VITALS — BP 136/60 | Ht 66.0 in | Wt 186.0 lb

## 2024-02-13 DIAGNOSIS — Z0000 Encounter for general adult medical examination without abnormal findings: Secondary | ICD-10-CM | POA: Diagnosis not present

## 2024-02-13 NOTE — Patient Instructions (Signed)
 Mr. Gallaga,  Thank you for taking the time for your Medicare Wellness Visit. I appreciate your continued commitment to your health goals. Please review the care plan we discussed, and feel free to reach out if I can assist you further.  Please note that Annual Wellness Visits do not include a physical exam. Some assessments may be limited, especially if the visit was conducted virtually. If needed, we may recommend an in-person follow-up with your provider.  Ongoing Care Seeing your primary care provider every 3 to 6 months helps us  monitor your health and provide consistent, personalized care.   Referrals If a referral was made during today's visit and you haven't received any updates within two weeks, please contact the referred provider directly to check on the status.  Recommended Screenings:  Health Maintenance  Topic Date Due   Medicare Annual Wellness Visit  11/11/2023   DTaP/Tdap/Td vaccine (4 - Td or Tdap) 04/29/2032   Flu Shot  Completed   Zoster (Shingles) Vaccine  Completed   Meningitis B Vaccine  Aged Out   Pneumococcal Vaccine for age over 46  Discontinued   COVID-19 Vaccine  Discontinued       02/13/2024    4:09 PM  Advanced Directives  Does Patient Have a Medical Advance Directive? No  Would patient like information on creating a medical advance directive? No - Patient declined    Vision: Annual vision screenings are recommended for early detection of glaucoma, cataracts, and diabetic retinopathy. These exams can also reveal signs of chronic conditions such as diabetes and high blood pressure.  Dental: Annual dental screenings help detect early signs of oral cancer, gum disease, and other conditions linked to overall health, including heart disease and diabetes.  Please see the attached documents for additional preventive care recommendations.

## 2024-02-13 NOTE — Progress Notes (Signed)
 I connected with  Philip Donaldson on 02/13/24 by a audio enabled telemedicine application and verified that I am speaking with the correct person using two identifiers.  Patient Location: Home  Provider Location: Home Office  I discussed the limitations of evaluation and management by telemedicine. The patient expressed understanding and agreed to proceed.  Subjective:   Philip Donaldson is a 89 y.o. male who presents for a Medicare Annual Wellness Visit.  Allergies (verified) Clarithromycin, Indomethacin, and Tuberculin tests   History: 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   Cancer (HCC)    skin Ca-melanoma- scalp   Chronic kidney disease, stage 3 unspecified (HCC) 05/29/2022   Diverticulitis    Elevated LDL cholesterol level 04/06/2021   Essential hypertension 01/31/2020   Gout 02/02/2012   History of gout 01/31/2020   Hypercholesterolemia 05/29/2022   Hyperlipidemia    Hypertension    Mass of arm, left 01/31/2020   New onset atrial fibrillation (HCC) 06/04/2022   Pain of right hand 04/29/2022   Tubular adenoma of colon 05/10/2011   Past Surgical History:  Procedure Laterality Date   CHOLECYSTECTOMY N/A 09/11/2023   Procedure: LAPAROSCOPIC CHOLECYSTECTOMY;  Surgeon: Aron Shoulders, MD;  Location: [facility];  Service: General;  Laterality: N/A;   COLONOSCOPY     INDOCYANINE GREEN  FLUORESCENCE IMAGING (ICG) N/A 09/11/2023   Procedure: INDOCYANINE GREEN  FLUORESCENCE IMAGING (ICG);  Surgeon: Aron Shoulders, MD;  Location: [facility];  Service: General;  Laterality: N/A;   POLYPECTOMY     SKIN CANCER EXCISION     to scalp   Family History  Problem Relation Age of Onset   Colon cancer Neg Hx    Esophageal cancer Neg Hx    Rectal cancer Neg Hx    Stomach cancer Neg Hx    Social History   Occupational History   Occupation: Retired   Tobacco Use   Smoking status: Former   Smokeless tobacco: Never  Advertising Account Planner   Vaping status: Never Used  Substance and  Sexual Activity   Alcohol use: No   Drug use: No   Sexual activity: Not on file   Tobacco Counseling Counseling given: Yes  SDOH Screenings   Food Insecurity: No Food Insecurity (02/13/2024)  Housing: Low Risk  (02/13/2024)  Transportation Needs: No Transportation Needs (02/13/2024)  Utilities: Not At Risk (02/13/2024)  Alcohol Screen: Low Risk  (11/11/2022)  Depression (PHQ2-9): Low Risk  (02/13/2024)  Financial Resource Strain: Low Risk  (11/11/2022)  Physical Activity: Sufficiently Active (02/13/2024)  Social Connections: Moderately Isolated (02/13/2024)  Stress: No Stress Concern Present (02/13/2024)  Tobacco Use: Medium Risk (02/13/2024)  Health Literacy: Adequate Health Literacy (02/13/2024)   Depression Screen    02/13/2024    4:13 PM 07/24/2023   10:45 AM 01/16/2023    3:41 PM 11/11/2022    8:47 AM 06/10/2022   10:10 AM 04/29/2022   10:00 AM 11/07/2021    9:14 AM  PHQ 2/9 Scores  PHQ - 2 Score 0 0 0 0 0 0 0  PHQ- 9 Score 0   0         Data saved with a previous flowsheet row definition     Goals Addressed               This Visit's Progress     remain active and healthy (pt-stated)         Visit info / Clinical Intake: Medicare Wellness Visit Type:: Subsequent Annual Wellness Visit Medicare Wellness Visit  Mode:: Telephone If telephone:: video declined If telephone or video:: pt reported vitals Interpreter Needed?: No Pre-visit prep was completed: yes AWV questionnaire completed by patient prior to visit?: no Living arrangements:: (!) lives alone Typical amount of pain: none Does pain affect daily life?: no Are you currently prescribed opioids?: no  Dietary Habits and Nutritional Risks How many meals a day?: 3 Eats fruit and vegetables daily?: yes Most meals are obtained by: having others provide food (lives in a retirement community) Diabetic:: no  Functional Status Activities of Daily Living (to include ambulation/medication): Independent Ambulation:  Independent Medication Administration: Needs assistance (comment) (son helps manage medications) Is this a change from baseline?: Pre-admission baseline Home Management: Independent (retirement community does a lot such as washing sheets etc, but patient can maintain his own area just fine) Manage your own finances?: yes Primary transportation is: facility / other Concerns about vision?: no *vision screening is required for [facility]* Concerns about hearing?: no  Fall Screening Falls in the past year?: 0 Number of falls in past year: 0 Was there an injury with Fall?: 0 Fall Risk Category Calculator: 0 Patient Fall Risk Level: Low Fall Risk  Fall Risk Patient at Risk for Falls Due to: No Fall Risks Fall risk Follow up: Falls prevention discussed; Education provided; Falls evaluation completed  Home and Transportation Safety: All rugs have non-skid backing?: N/A, no rugs All stairs or steps have railings?: N/A, no stairs Grab bars in the bathtub or shower?: yes Have non-skid surface in bathtub or shower?: yes Good home lighting?: yes Regular seat belt use?: yes Hospital stays in the last year:: (!) yes How many hospital stays:: 1 Reason: cholecystectomy  Cognitive Assessment Difficulty concentrating, remembering, or making decisions? : no Will 6CIT or Mini Cog be Completed: no 6CIT or Mini Cog Declined: patient alert, oriented, able to answer questions appropriately and recall recent events  Advance Directives (For Healthcare) Does Patient Have a Medical Advance Directive?: No Does patient want to make changes to medical advance directive?: No - Patient declined Type of Advance Directive: Living will; Healthcare Power of Attorney Copy of Healthcare Power of Attorney in Chart?: No - copy requested Copy of Living Will in Chart?: No - copy requested Would patient like information on creating a medical advance directive?: No - Patient declined  Reviewed/Updated  Reviewed/Updated:  All        Objective:    Today's Vitals   02/13/24 1551  BP: 136/60  Weight: 186 lb (84.4 kg)  Height: 5' 6 (1.676 m)   Body mass index is 30.02 kg/m.  Current Medications (verified) Outpatient Encounter Medications as of 02/13/2024  Medication Sig   acetaminophen  (TYLENOL ) 500 MG tablet Take 2 tablets (1,000 mg total) by mouth every 8 (eight) hours.   allopurinol  (ZYLOPRIM ) 100 MG tablet Take 1 tablet (100 mg total) by mouth daily.   amLODipine  (NORVASC ) 10 MG tablet Take 1 tablet (10 mg total) by mouth daily.   apixaban  (ELIQUIS ) 5 MG TABS tablet Take 5 mg by mouth 2 (two) times daily.   colchicine  0.6 MG tablet Take 1 tablet (0.6 mg total) by mouth daily.   furosemide  (LASIX ) 20 MG tablet Take 1 tablet (20 mg total) by mouth daily.   lisinopril  (ZESTRIL ) 40 MG tablet Take 1 tablet (40 mg total) by mouth daily.   lovastatin  (MEVACOR ) 40 MG tablet TAKE 1 TABLET AT BEDTIME   metoprolol  tartrate (LOPRESSOR ) 100 MG tablet Take 0.5 tablets (50 mg total) by mouth 2 (two) times  daily.   potassium chloride  SA (KLOR-CON  M) 20 MEQ tablet Take 1 tablet (20 mEq total) by mouth daily.   predniSONE  (DELTASONE ) 10 MG tablet Take 4 tablets (40 mg total) by mouth daily.   timolol  (TIMOPTIC ) 0.5 % ophthalmic solution Place 1 drop into the right eye 2 (two) times daily.   No facility-administered encounter medications on file as of 02/13/2024.   Hearing/Vision screen Hearing Screening - Comments:: Patient states he doesn't hear as well as he used to but gets by  Vision Screening - Comments:: Wears rx glasses - up to date with routine eye exams with  Dr. Elspeth Aran  Immunizations and Health Maintenance Health Maintenance  Topic Date Due   Medicare Annual Wellness (AWV)  02/12/2025   DTaP/Tdap/Td (4 - Td or Tdap) 04/29/2032   Influenza Vaccine  Completed   Zoster Vaccines- Shingrix  Completed   Meningococcal B Vaccine  Aged Out   Pneumococcal Vaccine: 50+ Years  Discontinued   COVID-19  Vaccine  Discontinued        Assessment/Plan:  This is a routine wellness examination for Jlyn.  Patient Care Team: Berneta Elsie Sayre, MD as PCP - General (Family Medicine) Aran Elspeth PARAS, MD as Consulting Physician (Ophthalmology) Revankar, Jennifer SAUNDERS, MD as Consulting Physician (Cardiology) Shona Rush, MD (Dermatology)  I have personally reviewed and noted the following in the patient's chart:   Medical and social history Use of alcohol, tobacco or illicit drugs  Current medications and supplements including opioid prescriptions. Functional ability and status Nutritional status Physical activity Advanced directives List of other physicians Hospitalizations, surgeries, and ER visits in previous 12 months Vitals Screenings to include cognitive, depression, and falls Referrals and appointments  No orders of the defined types were placed in this encounter.  In addition, I have reviewed and discussed with patient certain preventive protocols, quality metrics, and best practice recommendations. A written personalized care plan for preventive services as well as general preventive health recommendations were provided to patient.   Marilou Barnfield, CMA   02/13/2024   No follow-ups on file.  After Visit Summary: (MyChart) Due to this being a telephonic visit, the after visit summary with patients personalized plan was offered to patient via MyChart   Nurse Notes:

## 2024-03-04 ENCOUNTER — Other Ambulatory Visit: Payer: Self-pay | Admitting: Family Medicine

## 2024-03-04 DIAGNOSIS — E7800 Pure hypercholesterolemia, unspecified: Secondary | ICD-10-CM

## 2024-03-08 ENCOUNTER — Telehealth: Payer: Self-pay

## 2024-03-08 DIAGNOSIS — E7800 Pure hypercholesterolemia, unspecified: Secondary | ICD-10-CM

## 2024-03-08 MED ORDER — LOVASTATIN 40 MG PO TABS: 40.0000 mg | ORAL_TABLET | Freq: Every day | ORAL | 1 refills | Status: AC

## 2024-03-08 NOTE — Telephone Encounter (Signed)
 Copied from CRM #[id number]. Topic: Clinical - Medication Question >> Mar 08, 2024 11:20 AM Alfonso HERO wrote: Reason for CRM: patient calling for refill status of lovastatin  (MEVACOR ) 40 MG tablet.

## 2024-03-08 NOTE — Telephone Encounter (Signed)
RX sent to pharmacy.  Patient notified [facility] phone. Dm/cma

## 2024-03-10 NOTE — Telephone Encounter (Signed)
 Refilled 03/08/24 #90, 1 RF. Duplicate request declined.

## 2024-03-18 ENCOUNTER — Ambulatory Visit (INDEPENDENT_AMBULATORY_CARE_PROVIDER_SITE_OTHER): Admitting: Family Medicine

## 2024-03-18 ENCOUNTER — Ambulatory Visit: Payer: Self-pay | Admitting: Family Medicine

## 2024-03-18 ENCOUNTER — Encounter: Payer: Self-pay | Admitting: Family Medicine

## 2024-03-18 VITALS — BP 132/72 | HR 51 | Temp 97.8°F | Ht 66.0 in | Wt 195.0 lb

## 2024-03-18 DIAGNOSIS — I4891 Unspecified atrial fibrillation: Secondary | ICD-10-CM | POA: Diagnosis not present

## 2024-03-18 DIAGNOSIS — I10 Essential (primary) hypertension: Secondary | ICD-10-CM | POA: Diagnosis not present

## 2024-03-18 DIAGNOSIS — I872 Venous insufficiency (chronic) (peripheral): Secondary | ICD-10-CM | POA: Diagnosis not present

## 2024-03-18 DIAGNOSIS — Z8739 Personal history of other diseases of the musculoskeletal system and connective tissue: Secondary | ICD-10-CM

## 2024-03-18 LAB — BASIC METABOLIC PANEL WITH GFR
BUN: 15 mg/dL (ref 6–23)
CO2: 28 meq/L (ref 19–32)
Calcium: 10.2 mg/dL (ref 8.4–10.5)
Chloride: 103 meq/L (ref 96–112)
Creatinine, Ser: 1.1 mg/dL (ref 0.40–1.50)
GFR: 57.78 mL/min — ABNORMAL LOW (ref >60.00)
Glucose, Bld: 87 mg/dL (ref 70–99)
Potassium: 3.8 meq/L (ref 3.5–5.1)
Sodium: 139 meq/L (ref 135–145)

## 2024-03-18 LAB — URIC ACID: Uric Acid, Serum: 7.3 mg/dL (ref 4.0–7.8)

## 2024-03-18 NOTE — Progress Notes (Signed)
 Established Patient Office Visit   Subjective:  Patient ID: Philip Donaldson, male    DOB: Dec 16, 1928  Age: 89 y.o. MRN: [id number]  Chief Complaint  Patient presents with   Follow-up    3 months,  not fasting, colchicine , ears need cleaning out wheeze when bend over    HPI Encounter Diagnoses  Name Primary?   Essential hypertension Yes   Atrial fibrillation, unspecified type (HCC)    Venous incompetence    History of gout    Accompanied by his daughter Barnie.  Has been taking the allopurinol  with colchicine  since September.  No problems with these medications.  Continues lisinopril  40, furosemide  20, metoprolol  tartrate 50 mg twice daily for history of hypertension with atrial fibs.  He is taking the potassium daily.  No shortness of breath or chest pain.  Swelling in legs goes to normal overnight but then increases during the day.   Review of Systems  Constitutional: Negative.   HENT: Negative.    Eyes:  Negative for blurred vision, discharge and redness.  Respiratory: Negative.  Negative for shortness of breath.   Cardiovascular: Negative.  Negative for chest pain.  Gastrointestinal:  Negative for abdominal pain.  Genitourinary: Negative.   Musculoskeletal: Negative.  Negative for myalgias.  Skin:  Negative for rash.  Neurological:  Negative for tingling, loss of consciousness and weakness.  Endo/Heme/Allergies:  Negative for polydipsia.    Current Medications[1]   Objective:     BP 132/72   Pulse (!) 51   Temp 97.8 F (36.6 C) (Temporal)   Ht 5' 6 (1.676 m)   Wt 195 lb (88.5 kg)   SpO2 96%   BMI 31.47 kg/m    Physical Exam Constitutional:      General: He is not in acute distress.    Appearance: Normal appearance. He is not ill-appearing, toxic-appearing or diaphoretic.  HENT:     Head: Normocephalic and atraumatic.     Right Ear: External ear normal.     Left Ear: External ear normal.     Ears:      Mouth/Throat:     Mouth: Mucous membranes are  moist.     Pharynx: Oropharynx is clear. No oropharyngeal exudate or posterior oropharyngeal erythema.  Eyes:     General: No scleral icterus.       Right eye: No discharge.        Left eye: No discharge.     Extraocular Movements: Extraocular movements intact.     Conjunctiva/sclera: Conjunctivae normal.     Pupils: Pupils are equal, round, and reactive to light.  Cardiovascular:     Rate and Rhythm: Normal rate. Rhythm irregularly irregular.  Pulmonary:     Effort: Pulmonary effort is normal. No respiratory distress.     Breath sounds: Normal breath sounds. No wheezing, rhonchi or rales.  Abdominal:     General: Bowel sounds are normal.     Tenderness: There is no abdominal tenderness. There is no guarding.  Musculoskeletal:     Cervical back: No rigidity or tenderness.     Right lower leg: Swelling present. Edema present.     Left lower leg: Swelling present. Edema present.     Comments: No stockings.  Skin:    General: Skin is warm and dry.  Neurological:     Mental Status: He is alert and oriented to person, place, and time.  Psychiatric:        Mood and Affect: Mood normal.  Behavior: Behavior normal.      No results found for any visits on 03/18/24.    The ASCVD Risk score (Arnett DK, et al., 2019) failed to calculate for the following reasons:   The 2019 ASCVD risk score is only valid for ages 3 to 81   * - Cholesterol units were assumed    Assessment & Plan:   Essential hypertension -     Basic metabolic panel with GFR  Atrial fibrillation, unspecified type (HCC)  Venous incompetence  History of gout -     Uric acid    Return in about 3 months (around 06/16/2024), or May stop colchicine .  Continue allopurinol .  Please wear stockings.SABRA Elsie Sim Berneta, MD    [1]  Current Outpatient Medications:    acetaminophen  (TYLENOL ) 500 MG tablet, Take 2 tablets (1,000 mg total) by mouth every 8 (eight) hours. (Patient taking differently:  Take 1,000 mg by mouth as needed.), Disp: 30 tablet, Rfl: 0   allopurinol  (ZYLOPRIM ) 100 MG tablet, Take 1 tablet (100 mg total) by mouth daily., Disp: 90 tablet, Rfl: 1   amLODipine  (NORVASC ) 10 MG tablet, Take 1 tablet (10 mg total) by mouth daily., Disp: 90 tablet, Rfl: 3   apixaban  (ELIQUIS ) 5 MG TABS tablet, Take 5 mg by mouth 2 (two) times daily., Disp: , Rfl:    furosemide  (LASIX ) 20 MG tablet, Take 1 tablet (20 mg total) by mouth daily., Disp: 90 tablet, Rfl: 2   lisinopril  (ZESTRIL ) 40 MG tablet, Take 1 tablet (40 mg total) by mouth daily., Disp: 90 tablet, Rfl: 3   lovastatin  (MEVACOR ) 40 MG tablet, Take 1 tablet (40 mg total) by mouth at bedtime., Disp: 90 tablet, Rfl: 1   metoprolol  tartrate (LOPRESSOR ) 100 MG tablet, Take 0.5 tablets (50 mg total) by mouth 2 (two) times daily., Disp: 90 tablet, Rfl: 3   potassium chloride  SA (KLOR-CON  M) 20 MEQ tablet, Take 1 tablet (20 mEq total) by mouth daily., Disp: 90 tablet, Rfl: 2   timolol  (TIMOPTIC ) 0.5 % ophthalmic solution, Place 1 drop into the right eye 2 (two) times daily., Disp: , Rfl:    predniSONE  (DELTASONE ) 10 MG tablet, Take 4 tablets (40 mg total) by mouth daily. (Patient not taking: Reported on 03/18/2024), Disp: 20 tablet, Rfl: 0

## 2024-04-15 ENCOUNTER — Other Ambulatory Visit: Payer: Self-pay | Admitting: Family Medicine

## 2024-04-15 DIAGNOSIS — I10 Essential (primary) hypertension: Secondary | ICD-10-CM

## 2024-04-15 DIAGNOSIS — I4891 Unspecified atrial fibrillation: Secondary | ICD-10-CM

## 2024-05-11 ENCOUNTER — Telehealth: Payer: Self-pay | Admitting: Nurse Practitioner

## 2024-05-11 ENCOUNTER — Other Ambulatory Visit: Payer: Self-pay | Admitting: Cardiology

## 2024-05-11 ENCOUNTER — Other Ambulatory Visit: Payer: Self-pay | Admitting: Family Medicine

## 2024-05-11 DIAGNOSIS — E790 Hyperuricemia without signs of inflammatory arthritis and tophaceous disease: Secondary | ICD-10-CM

## 2024-05-11 DIAGNOSIS — E876 Hypokalemia: Secondary | ICD-10-CM

## 2024-05-11 DIAGNOSIS — Z8739 Personal history of other diseases of the musculoskeletal system and connective tissue: Secondary | ICD-10-CM

## 2024-05-11 NOTE — Telephone Encounter (Signed)
 Copied from CRM #[id number]. Topic: Clinical - Medication Refill >> May 11, 2024  9:35 AM Deleta RAMAN wrote: Medication: potassium chloride  SA (KLOR-CON  M) 20 MEQ tablet  Has the patient contacted their pharmacy? No (Agent: If no, request that the patient contact the pharmacy for the refill. If patient does not wish to contact the pharmacy document the reason why and proceed with request.) (Agent: If yes, when and what did the pharmacy advise?)  This is the patient's preferred pharmacy:  [facility] - [place], [place] - [address] [place] [address] Phone: [phone] Fax: [phone]  Is this the correct pharmacy for this prescription? Yes If no, delete pharmacy and type the correct one.   Has the prescription been filled recently? No  Is the patient out of the medication? No  Has the patient been seen for an appointment in the last year OR does the patient have an upcoming appointment? Yes  Can we respond through MyChart? No  Agent: Please be advised that Rx refills may take up to 3 business days. We ask that you follow-up with your pharmacy.

## 2024-05-12 MED ORDER — POTASSIUM CHLORIDE CRYS ER 20 MEQ PO TBCR: 20.0000 meq | EXTENDED_RELEASE_TABLET | Freq: Every day | ORAL | 2 refills | Status: AC

## 2024-05-12 NOTE — Addendum Note (Signed)
 Addended by: BERNETA FALLOW A on: 05/12/2024 11:20 AM   Modules accepted: Orders

## 2024-06-17 ENCOUNTER — Ambulatory Visit: Admitting: Family Medicine

## 2024-06-22 ENCOUNTER — Ambulatory Visit: Admitting: Family Medicine
